# Patient Record
Sex: Male | Born: 1970
Health system: Southern US, Community
[De-identification: ages and names within clinical notes are randomized; demographics above are authoritative.]

## PROBLEM LIST (undated history)

## (undated) DIAGNOSIS — M5412 Radiculopathy, cervical region: Secondary | ICD-10-CM

## (undated) DIAGNOSIS — Z79891 Long term (current) use of opiate analgesic: Secondary | ICD-10-CM

## (undated) DIAGNOSIS — N503 Cyst of epididymis: Secondary | ICD-10-CM

## (undated) DIAGNOSIS — G589 Mononeuropathy, unspecified: Secondary | ICD-10-CM

## (undated) DIAGNOSIS — M5030 Other cervical disc degeneration, unspecified cervical region: Secondary | ICD-10-CM

## (undated) DIAGNOSIS — G894 Chronic pain syndrome: Secondary | ICD-10-CM

## (undated) HISTORY — PX: CHOLECYSTECTOMY: SHX55

## (undated) HISTORY — PX: RECONSTRUCTION MANDIBLE: SUR1082

---

## 2011-01-20 ENCOUNTER — Inpatient Hospital Stay (INDEPENDENT_AMBULATORY_CARE_PROVIDER_SITE_OTHER)
Admission: RE | Admit: 2011-01-20 | Discharge: 2011-01-20 | Disposition: A | Discharge status: Home / Self Care | Payer: Medicare HMO | Source: Ambulatory Visit | Attending: Emergency Medicine | Admitting: Emergency Medicine

## 2011-01-20 ENCOUNTER — Encounter: Payer: Self-pay | Admitting: Emergency Medicine

## 2011-01-20 DIAGNOSIS — M25529 Pain in unspecified elbow: Secondary | ICD-10-CM

## 2011-01-20 DIAGNOSIS — R29818 Other symptoms and signs involving the nervous system: Secondary | ICD-10-CM

## 2011-01-20 DIAGNOSIS — R509 Fever, unspecified: Secondary | ICD-10-CM

## 2011-01-20 LAB — CONVERTED CEMR LAB
Blood in Urine, dipstick: NEGATIVE
Glucose, Urine, Semiquant: NEGATIVE
Nitrite: NEGATIVE
Protein, U semiquant: NEGATIVE
Rapid Strep: NEGATIVE
Specific Gravity, Urine: >=1.03
Urobilinogen, UA: 0.2

## 2011-01-23 ENCOUNTER — Telehealth (INDEPENDENT_AMBULATORY_CARE_PROVIDER_SITE_OTHER): Payer: Self-pay | Admitting: *Deleted

## 2011-06-24 NOTE — Telephone Encounter (Signed)
  Phone Note Outgoing Call Call back at [facility] Phone [phone]   Call placed by: Lajean Saver RN,  January 23, 2011 11:29 AM Call placed to: Patient Summary of Call: Callback: No answer. Message left that "labs were normal" and to call with questions or concerns     Appended Document:  Patient called back and reports that he is improving. He will f/u with his PCP if needed

## 2011-06-24 NOTE — Progress Notes (Signed)
Summary: achey joints/TM(rm5)   Vital Signs:  Patient Profile:   40 Years Old Male CC:      previous fever, swollen fingers, body aches Height:     74 inches Weight:      180.4 pounds O2 Sat:      99 % O2 treatment:    Room Air Temp:     98.0 degrees F oral Pulse rate:   89 / minute Resp:     20 per minute BP sitting:   99 / 64  (left arm) Cuff size:   regular  Vitals Entered By: Linton Flemings RN (January 20, 2011 1:39 PM)                  Updated Prior Medication List: LUNESTA 3 MG TABS (ESZOPICLONE) rarely taken  Current Allergies: No known allergies History of Present Illness History from: patient Chief Complaint: previous fever, swollen fingers, body aches History of Present Illness: Initially had fever, body aches, muscle aches.  Now with swollen fingers which has improved since yesterday.  No rash, HA, tick bites.  Recently traveled to [place].    REVIEW OF SYSTEMS Constitutional Symptoms       Complains of chills and fatigue.     Denies fever, night sweats, weight loss, and weight gain.  Eyes       Denies change in vision, eye pain, eye discharge, glasses, contact lenses, and eye surgery. Ear/Nose/Throat/Mouth       Denies hearing loss/aids, change in hearing, ear pain, ear discharge, dizziness, frequent runny nose, frequent nose bleeds, sinus problems, sore throat, hoarseness, and tooth pain or bleeding.  Respiratory       Denies dry cough, productive cough, wheezing, shortness of breath, asthma, bronchitis, and emphysema/COPD.  Cardiovascular       Complains of tires easily with exhertion.      Denies murmurs and chest pain.    Gastrointestinal       Denies stomach pain, nausea/vomiting, diarrhea, constipation, blood in bowel movements, and indigestion. Genitourniary       Denies painful urination, kidney stones, and loss of urinary control. Neurological       Complains of numbness and tingling.      Denies paralysis, seizures, and  fainting/blackouts. Musculoskeletal       Complains of muscle pain, joint pain, joint stiffness, decreased range of motion, redness, and swelling.      Denies muscle weakness and gout.  Skin       Denies bruising, unusual mles/lumps or sores, and hair/skin or nail changes.  Psych       Denies mood changes, temper/anger issues, anxiety/stress, speech problems, depression, and sleep problems. Other Comments: symtoms started one week ago with 2 day fever, joint pain(knees, shoulders, fingers) 4-5days ago and swelling started 2 days ago. t-max 102. states he just have not been feeling normal   Past History:  Past Medical History: Unremarkable  Past Surgical History: deviated septum repair,  corrective jaw sx  Family History: none  Social History: smoke-no alcohol-no drugs-no Physical Exam General appearance: well developed, well nourished, no acute distress Ears: normal, no lesions or deformities Nasal: mucosa pink, nonedematous, no septal deviation, turbinates normal Oral/Pharynx: tongue normal, posterior pharynx without erythema or exudate Chest/Lungs: no rales, wheezes, or rhonchi bilateral, breath sounds equal without effort Heart: regular rate and  rhythm, no murmur Extremities: I don't see any finger swelling, no tenderness in joints, FROM Skin: no obvious rashes or lesions MSE: oriented to time, place, and  person Assessment New Problems: MUSCULOSKELETAL PAIN (ICD-781.99) FEVER (ICD-780.60) PAIN IN JOINT, UPPER ARM (ICD-719.42)   Plan New Medications/Changes: PREDNISONE (PAK) 10 MG TABS (PREDNISONE) 6 day pack, use as directed  #1 x 0, 01/20/2011, Hoyt Koch MD  New Orders: New Patient Level III [phone] UA Dipstick w/o Micro (automated)  [81003] Rapid Strep [87880] T-CBC w/Diff [60454-09811] T-Uric Acid (Blood) [91478-29562] T- * Misc. Laboratory test [99999] T-Rheumatoid Factor [13086-57846] T-Iron [96295-28413] TLB-CK-MB (Creatine Kinase MB)  [82553-CKMB] Planning Comments:   May just be a normal viral infection and is getting better.  Rx for a pred pack.  However with some strange symptoms such as finger swelling, chronic back spasms.  Will check labs to start ruling out other things such as polymyositis, hemochromatosis, rheumatoid, lymes.  Rapid strep neg so likely not scarlett fever.  If improving, should let body rest and hydrate.  If not, then should f/u with PCP to start talking about further workup.    The patient and/or caregiver has been counseled thoroughly with regard to medications prescribed including dosage, schedule, interactions, rationale for use, and possible side effects and they verbalize understanding.  Diagnoses and expected course of recovery discussed and will return if not improved as expected or if the condition worsens. Patient and/or caregiver verbalized understanding.  Prescriptions: PREDNISONE (PAK) 10 MG TABS (PREDNISONE) 6 day pack, use as directed  #1 x 0   Entered and Authorized by:   Hoyt Koch MD   Signed by:   Hoyt Koch MD on 01/20/2011   Method used:   Print then Give to Patient   RxID:   [id number]   Orders Added: 1)  New Patient Level III [34742] 2)  UA Dipstick w/o Micro (automated)  [81003] 3)  Rapid Strep [87880] 4)  T-CBC w/Diff [59563-87564] 5)  T-Uric Acid (Blood) [84550-23180] 6)  T- * Misc. Laboratory test [99999] 7)  T-Rheumatoid Factor [phone] 8)  T-Iron [66063-01601] 9)  TLB-CK-MB (Creatine Kinase MB) [82553-CKMB]    Laboratory Results   Urine Tests  Date/Time Received: January 20, 2011 2:24 PM  Date/Time Reported: January 20, 2011 2:24 PM   Routine Urinalysis   Color: dark yellow Appearance: Clear Glucose: negative   (Normal Range: Negative) Bilirubin: negative   (Normal Range: Negative) Ketone: trace (5)   (Normal Range: Negative) Spec. Gravity: >=1.030   (Normal Range: 1.003-1.035) Blood: negative   (Normal Range: Negative) pH: 5.5    (Normal Range: 5.0-8.0) Protein: negative   (Normal Range: Negative) Urobilinogen: 0.2   (Normal Range: 0-1) Nitrite: negative   (Normal Range: Negative) Leukocyte Esterace: negative   (Normal Range: Negative)    Date/Time Received: January 20, 2011 1:59 PM  Date/Time Reported: January 20, 2011 1:59 PM   Other Tests  Rapid Strep: negative

## 2011-07-12 ENCOUNTER — Emergency Department (HOSPITAL_BASED_OUTPATIENT_CLINIC_OR_DEPARTMENT_OTHER)
Admission: EM | Admit: 2011-07-12 | Discharge: 2011-07-12 | Disposition: A | Discharge status: Home / Self Care | Payer: Medicare HMO | Attending: Emergency Medicine | Admitting: Emergency Medicine

## 2011-07-12 ENCOUNTER — Emergency Department (INDEPENDENT_AMBULATORY_CARE_PROVIDER_SITE_OTHER): Payer: Medicare HMO

## 2011-07-12 DIAGNOSIS — R51 Headache: Secondary | ICD-10-CM

## 2011-07-12 DIAGNOSIS — H5710 Ocular pain, unspecified eye: Secondary | ICD-10-CM | POA: Insufficient documentation

## 2011-07-12 DIAGNOSIS — J3489 Other specified disorders of nose and nasal sinuses: Secondary | ICD-10-CM

## 2011-07-12 DIAGNOSIS — J329 Chronic sinusitis, unspecified: Secondary | ICD-10-CM | POA: Insufficient documentation

## 2011-07-12 DIAGNOSIS — R220 Localized swelling, mass and lump, head: Secondary | ICD-10-CM

## 2011-07-12 LAB — CBC
MCH: 30 pg (ref 26.0–34.0)
MCV: 81.4 fL (ref 78.0–100.0)
Platelets: 179 10*3/uL (ref 150–400)
RBC: 4.9 MIL/uL (ref 4.22–5.81)
RDW: 12.8 % (ref 11.5–15.5)
WBC: 6.1 10*3/uL (ref 4.0–10.5)

## 2011-07-12 LAB — BASIC METABOLIC PANEL
CO2: 31 mEq/L (ref 19–32)
Calcium: 9.2 mg/dL (ref 8.4–10.5)
Creatinine, Ser: 1 mg/dL (ref 0.50–1.35)
Glucose, Bld: 87 mg/dL (ref 70–99)

## 2011-07-12 MED ORDER — OXYCODONE-ACETAMINOPHEN 5-325 MG PO TABS: 1.0000 | ORAL_TABLET | ORAL | Status: AC | PRN

## 2011-07-12 MED ORDER — AMOXICILLIN-POT CLAVULANATE 500-125 MG PO TABS: 1.0000 | ORAL_TABLET | Freq: Two times a day (BID) | ORAL | Status: AC

## 2011-07-12 MED ORDER — KETOROLAC TROMETHAMINE 30 MG/ML IJ SOLN
30.0000 mg | Freq: Once | INTRAMUSCULAR | Status: AC
  Administered 2011-07-12: 30 mg via INTRAVENOUS
  Filled 2011-07-12: qty 1

## 2011-07-12 MED ORDER — IOHEXOL 350 MG/ML SOLN
100.0000 mL | Freq: Once | INTRAVENOUS | Status: AC | PRN
  Administered 2011-07-12: 100 mL via INTRAVENOUS

## 2011-07-12 NOTE — ED Provider Notes (Signed)
History     CSN: [id number]  Arrival date & time 07/12/11  1435   First MD Initiated Contact with Patient 07/12/11 1544      Chief Complaint  Patient presents with  . Eye Problem  . Headache    (Consider location/radiation/quality/duration/timing/severity/associated sxs/prior treatment) The history is provided by the patient.   the patient reports left maxillary sinus surgery approximately one month ago.  He's continued to have pain in his left face since then the scene his oral surgeon several times however yesterday developed worsening pain and mild pain with range of motion of his left eye.  He says he awoke this morning he had a small amount of swelling to his left lower lid which is now resolved.  He reports his vision in his left eye is normal.  He denies fevers and chills.  He denies dental pain.  He denies trismus or malocclusion.  He said no neck pain or headache.  He's had no changes in his vision.  Nothing worsens the symptoms.  Nothing improves his symptoms.  Symptoms are constant.  He called his oral surgeon today however his oral surgery office was closed.  He is not currently on antibiotics  History reviewed. No pertinent past medical history.  Past Surgical History  Procedure Date  . Eye surgery   . Mouth surgery   . Nasal septum surgery     No family history on file.  History  Substance Use Topics  . Smoking status: Never Smoker   . Smokeless tobacco: Never Used  . Alcohol Use: Yes     occasional      Review of Systems  All other systems reviewed and are negative.    Allergies  Review of patient's allergies indicates no known allergies.  Home Medications   Current Outpatient Rx  Name Route Sig Dispense Refill  . ONE-DAILY MULTI VITAMINS PO TABS Oral Take 1 tablet by mouth daily.      Marland Kitchen PSEUDOEPHEDRINE-IBUPROFEN 30-200 MG PO CAPS Oral Take 2 tablets by mouth every 4 (four) hours as needed. For congestion     . SALINE NASAL SPRAY 0.65 % NA SOLN  Each Nare Place 2 sprays into both nostrils daily as needed. For congestion     . AMOXICILLIN-POT CLAVULANATE 500-125 MG PO TABS Oral Take 1 tablet (500 mg total) by mouth 2 (two) times daily. 28 tablet 0  . OXYCODONE-ACETAMINOPHEN 5-325 MG PO TABS Oral Take 1 tablet by mouth every 4 (four) hours as needed for pain. 35 tablet 0    BP 141/85  Pulse 93  Temp(Src) 98.5 F (36.9 C) (Oral)  Resp 16  Ht 6\' 1"  (1.854 m)  Wt 180 lb (81.647 kg)  BMI 23.75 kg/m2  SpO2 99%  Physical Exam  Nursing note and vitals reviewed. Constitutional: He is oriented to person, place, and time. He appears well-developed and well-nourished.  HENT:  Head: Normocephalic and atraumatic.       Mild tenderness over left maxillary sinus.  He has normal extraocular movements of his bilateral eyes.  Eyes: Conjunctivae, EOM and lids are normal. Pupils are equal, round, and reactive to light. Right eye exhibits no discharge, no exudate and no hordeolum. Left eye exhibits no chemosis, no discharge, no exudate and no hordeolum.  Neck: Normal range of motion.  Cardiovascular: Normal rate and regular rhythm.   Pulmonary/Chest: Effort normal and breath sounds normal.  Abdominal: Soft.  Musculoskeletal: Normal range of motion.  Neurological: He is alert and oriented to  person, place, and time.  Skin: Skin is warm and dry.  Psychiatric: He has a normal mood and affect. Judgment normal.    ED Course  Procedures (including critical care time)  Labs Reviewed  CBC - Abnormal; Notable for the following:    MCHC 36.8 (*)    All other components within normal limits  BASIC METABOLIC PANEL   Ct Maxillofacial W/cm  07/12/2011  *RADIOLOGY REPORT*  Clinical Data: Left facial pain and swelling.  3 weeks status post left facial and sinus surgery the  CT MAXILLOFACIAL WITH CONTRAST  Technique:  Multidetector CT imaging of the maxillofacial structures was performed with intravenous contrast. Multiplanar CT image reconstructions  were also generated.  Contrast: OMNIPAQUE IOHEXOL 350 MG/ML IV SOLN  Comparison: None.  Findings: Old fixation plate and screws are seen within the maxilla.  Postop changes are seen from resection of the medial wall of the left maxillary sinus as well as an enterostomy in the anterior wall of the left maxillary sinus.  Moderate mucosal thickening is seen within the left maxillary sinus, however there is no evidence of air-fluid levels.  No abscess is seen within the left facial or periorbital soft tissues.  No definite focal inflammatory process identified.  The globes and intraorbital contents are normal in appearance.  No evidence of acute fracture or bone destruction involving the orbits or facial bones.  The  IMPRESSION:  1.  Postop changes from recent left maxillary sinus surgery. Moderate maxillary sinus mucosal thickening.  No sinus air fluid level. 2.  No evidence of facial or periorbital abscess. 3.  Normal appearance of the globes and intraorbital contents.  Original Report Authenticated By: Danae Orleans, M.D.   I personally reviewed the patient's CT scan  1. Sinusitis       MDM  The patient has evidence of left maxillary sinusitis without extension into his orbital space.  We put on 14 days of Augmentin and instructed to follow up closely with his oral surgeon        Lyanne Co, MD 07/12/11 [phone]

## 2011-07-12 NOTE — ED Notes (Signed)
Pt reports left eye pain that started yesterday and headache unrelieved after taking Aleve.

## 2012-08-15 ENCOUNTER — Encounter (HOSPITAL_BASED_OUTPATIENT_CLINIC_OR_DEPARTMENT_OTHER): Payer: Self-pay | Admitting: *Deleted

## 2012-08-15 ENCOUNTER — Emergency Department (HOSPITAL_BASED_OUTPATIENT_CLINIC_OR_DEPARTMENT_OTHER)
Admission: EM | Admit: 2012-08-15 | Discharge: 2012-08-15 | Disposition: A | Discharge status: Home / Self Care | Payer: BC Managed Care – PPO | Attending: Emergency Medicine | Admitting: Emergency Medicine

## 2012-08-15 DIAGNOSIS — R209 Unspecified disturbances of skin sensation: Secondary | ICD-10-CM | POA: Insufficient documentation

## 2012-08-15 DIAGNOSIS — M549 Dorsalgia, unspecified: Secondary | ICD-10-CM | POA: Insufficient documentation

## 2012-08-15 DIAGNOSIS — M542 Cervicalgia: Secondary | ICD-10-CM | POA: Insufficient documentation

## 2012-08-15 DIAGNOSIS — M5020 Other cervical disc displacement, unspecified cervical region: Secondary | ICD-10-CM | POA: Insufficient documentation

## 2012-08-15 HISTORY — DX: Mononeuropathy, unspecified: G58.9

## 2012-08-15 MED ORDER — HYDROCODONE-ACETAMINOPHEN 10-325 MG PO TABS: 1.0000 | ORAL_TABLET | Freq: Four times a day (QID) | ORAL | Status: DC | PRN

## 2012-08-15 MED ORDER — DEXAMETHASONE SODIUM PHOSPHATE 10 MG/ML IJ SOLN
10.0000 mg | Freq: Once | INTRAMUSCULAR | Status: AC
  Administered 2012-08-15: 10 mg via INTRAMUSCULAR
  Filled 2012-08-15: qty 1

## 2012-08-15 MED ORDER — HYDROMORPHONE HCL PF 1 MG/ML IJ SOLN
1.0000 mg | Freq: Once | INTRAMUSCULAR | Status: DC
  Filled 2012-08-15: qty 1

## 2012-08-15 NOTE — ED Notes (Signed)
Pt states he has been seeing an orthopedist for about 4 months. Given steroid injections, last one being about 3 weeks ago. Dx'd with "pinched nerve and compressed disc" Returned from a trip yesterday and woke up today with increased pain and numbness to left arm. No neuro deficits noted. EKG done at triage.

## 2012-08-15 NOTE — ED Provider Notes (Signed)
History   This chart was scribed for Rolan Bucco, MD scribed by Magnus Sinning. The patient was seen in room MH11/MH11 at 15:41   CSN: [id number]  Arrival date & time 08/15/12  1429    Chief Complaint  Patient presents with  . Arm Pain    (Consider location/radiation/quality/duration/timing/severity/associated sxs/prior treatment) Patient is a 42 y.o. male presenting with arm pain. The history is provided by the patient. No language interpreter was used.  Arm Pain Pertinent negatives include no chest pain, no abdominal pain, no headaches and no shortness of breath.   Roger Gonzales is a 42 y.o. male who presents to the Emergency Department complaining of constant moderate left sided neck pain,  with associated numbness to 50% his left arm and intermittent tingling to the two second and third digits of the left hand, all onset today.  He denies weakness to hand/arm.  The patient explains that the neck pain goes down the left side of his neck but does not radiate into his shoulder.   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 He states he has not been feeling well since return and that he was having back pain and stiffness last night. He explains today he woke up with mild numbness of his left arm this morning and tingling sxs. He reports taking prescribed hydrocodone last night and this morning, as well as two advil last night. He says the pain is aggravated with movement and says he was dx'd with pinched nerve and compressed disk between c4 and c5, which he treated with epidural steroid injections for the last 4-5 months. He believes hx of sxs was exacerbated by sitting for long periods of time during flights. He has had similar symptoms of left arm numbness similar to todays episode in the past.  He says is last epidural injection was about 1 month ago, which he says provided relief for the past three weeks. Reports he has had intermittent similar sxs for the past 5 months.   The  patient denies lower extremity numbness or weakness, lower back pain, fever, cough, cold, or vomiting.  States he plans to see orthopedist, Dr. Ethelene Hal in three days for follow up.  Past Medical History  Diagnosis Date  . Compressed cervical disc   . Pinched nerve     Past Surgical History  Procedure Date  . Eye surgery   . Mouth surgery   . Nasal septum surgery     History reviewed. No pertinent family history.  History  Substance Use Topics  . Smoking status: Never Smoker   . Smokeless tobacco: Never Used  . Alcohol Use: Yes     Comment: occasional      Review of Systems  Constitutional: Negative for fever, chills, diaphoresis and fatigue.  HENT: Positive for neck pain. Negative for congestion, rhinorrhea and sneezing.   Eyes: Negative.   Respiratory: Negative for cough, chest tightness and shortness of breath.   Cardiovascular: Negative for chest pain and leg swelling.  Gastrointestinal: Negative for nausea, vomiting, abdominal pain, diarrhea and blood in stool.  Genitourinary: Negative for frequency, hematuria, flank pain and difficulty urinating.  Musculoskeletal: Positive for back pain. Negative for arthralgias.  Skin: Negative for rash.  Neurological: Positive for numbness. Negative for dizziness, speech difficulty, weakness and headaches.  All other systems reviewed and are negative.    Allergies  Review of patient's allergies indicates no known allergies.  Home Medications   Current Outpatient Rx  Name  Route  Sig  Dispense  Refill  . HYDROCODONE-ACETAMINOPHEN 10-325 MG PO TABS   Oral   Take 1 tablet by mouth every 6 (six) hours as needed for pain.   6 tablet   0   . ONE-DAILY MULTI VITAMINS PO TABS   Oral   Take 1 tablet by mouth daily.           Marland Kitchen PSEUDOEPHEDRINE-IBUPROFEN 30-200 MG PO CAPS   Oral   Take 2 tablets by mouth every 4 (four) hours as needed. For congestion          . SALINE NASAL SPRAY 0.65 % NA SOLN   Each Nare   Place 2  sprays into both nostrils daily as needed. For congestion            BP 103/67  Pulse 73  Temp 98.3 F (36.8 C) (Oral)  Resp 14  Ht 6\' 1"  (1.854 m)  Wt 175 lb (79.379 kg)  BMI 23.09 kg/m2  SpO2 100%  Physical Exam  Constitutional: He is oriented to person, place, and time. He appears well-developed and well-nourished.  HENT:  Head: Normocephalic and atraumatic.  Eyes: Pupils are equal, round, and reactive to light.  Neck: Normal range of motion. Neck supple.  Cardiovascular: Normal rate, regular rhythm and normal heart sounds.   Pulmonary/Chest: Effort normal and breath sounds normal. No respiratory distress. He has no wheezes. He has no rales. He exhibits no tenderness.  Abdominal: Soft. Bowel sounds are normal. There is no tenderness. There is no rebound and no guarding.  Musculoskeletal: Normal range of motion. He exhibits tenderness. He exhibits no edema.       Tenderness to the left lower cervical peri spinal area and left trapezius.   Lymphadenopathy:    He has no cervical adenopathy.  Neurological: He is alert and oriented to person, place, and time. He has normal strength. No cranial nerve deficit or sensory deficit.  Skin: Skin is warm and dry. No rash noted.  Psychiatric: He has a normal mood and affect.    ED Course  Procedures (including critical care time) DIAGNOSTIC STUDIES: Oxygen Saturation is 100% on room air, normal by my interpretation.    COORDINATION OF CARE: 15:45: Physical exam performed Labs Reviewed - No data to display No results found.   1. HNP (herniated nucleus pulposus), cervical       MDM  Pt with left neck pain with radiculopathy and some left arm tingling/numbness.  No weakness.  Similar symptoms over past few months.  Pt seeing Dr Ethelene Hal who prescribes Hydrocodone.  Says that he is out.  Will rx enough to last over weekend, given shot of dilaudid/decadron.  Will f/u with Dr Ethelene Hal. I personally performed the services described in  this documentation, which was scribed in my presence.  The recorded information has been reviewed and considered.         Rolan Bucco, MD 08/15/12 [phone]

## 2012-09-19 HISTORY — PX: OTHER SURGICAL HISTORY: SHX169

## 2013-10-20 HISTORY — PX: OTHER SURGICAL HISTORY: SHX169

## 2013-10-28 ENCOUNTER — Encounter (HOSPITAL_BASED_OUTPATIENT_CLINIC_OR_DEPARTMENT_OTHER): Payer: Self-pay | Admitting: Emergency Medicine

## 2013-10-28 ENCOUNTER — Emergency Department (HOSPITAL_BASED_OUTPATIENT_CLINIC_OR_DEPARTMENT_OTHER)
Admission: EM | Admit: 2013-10-28 | Discharge: 2013-10-28 | Disposition: A | Discharge status: Home / Self Care | Payer: BC Managed Care – PPO | Attending: Emergency Medicine | Admitting: Emergency Medicine

## 2013-10-28 DIAGNOSIS — Z8739 Personal history of other diseases of the musculoskeletal system and connective tissue: Secondary | ICD-10-CM | POA: Insufficient documentation

## 2013-10-28 DIAGNOSIS — Z9889 Other specified postprocedural states: Secondary | ICD-10-CM | POA: Insufficient documentation

## 2013-10-28 DIAGNOSIS — R221 Localized swelling, mass and lump, neck: Principal | ICD-10-CM

## 2013-10-28 DIAGNOSIS — Z79899 Other long term (current) drug therapy: Secondary | ICD-10-CM | POA: Insufficient documentation

## 2013-10-28 DIAGNOSIS — R220 Localized swelling, mass and lump, head: Secondary | ICD-10-CM | POA: Insufficient documentation

## 2013-10-28 DIAGNOSIS — Z8669 Personal history of other diseases of the nervous system and sense organs: Secondary | ICD-10-CM | POA: Insufficient documentation

## 2013-10-28 MED ORDER — CLINDAMYCIN HCL 300 MG PO CAPS: ORAL_CAPSULE | ORAL | Status: DC

## 2013-10-28 MED ORDER — HYDROCODONE-ACETAMINOPHEN 5-325 MG PO TABS: 2.0000 | ORAL_TABLET | ORAL | Status: DC | PRN

## 2013-10-28 NOTE — Discharge Instructions (Signed)

## 2013-10-28 NOTE — ED Notes (Signed)
States he had surgery 2 weeks ago to remove screws from his right sinus that were infected from a previous surgery. He woke with slight swelling to the right side of his face and feels he may have a slight infection. Here today for pain control. Was unable to see his MD in the office.

## 2013-10-28 NOTE — ED Provider Notes (Signed)
CSN: [id number]     Arrival date & time 10/28/13  1118 History   First MD Initiated Contact with Patient 10/28/13 1243     Chief Complaint  Patient presents with  . Dental Pain     (Consider location/radiation/quality/duration/timing/severity/associated sxs/prior Treatment) Patient is a 43 y.o. male presenting with tooth pain. The history is provided by the patient. No language interpreter was used.  Dental Pain Location:  Upper Upper teeth location:  2/RU 2nd molar and 3/RU 1st molar Quality:  Aching Severity:  Moderate Onset quality:  Gradual Duration:  2 days Timing:  Constant Progression:  Worsening Chronicity:  New Prior workup: oral surgery. Relieved by:  Nothing Worsened by:  Nothing tried Associated symptoms: no fever   Risk factors: no alcohol problem     Past Medical History  Diagnosis Date  . Compressed cervical disc   . Pinched nerve    Past Surgical History  Procedure Laterality Date  . Eye surgery    . Mouth surgery    . Nasal septum surgery     No family history on file. History  Substance Use Topics  . Smoking status: Never Smoker   . Smokeless tobacco: Never Used  . Alcohol Use: Yes     Comment: occasional    Review of Systems  Constitutional: Negative for fever.  Musculoskeletal: Negative for joint swelling.  All other systems reviewed and are negative.     Allergies  Review of patient's allergies indicates no known allergies.  Home Medications   Current Outpatient Rx  Name  Route  Sig  Dispense  Refill  . clindamycin (CLEOCIN) 300 MG capsule      One tablet po qid   28 capsule   0   . HYDROcodone-acetaminophen (NORCO/VICODIN) 5-325 MG per tablet   Oral   Take 2 tablets by mouth every 4 (four) hours as needed.   20 tablet   0   . Multiple Vitamin (MULTIVITAMIN) tablet   Oral   Take 1 tablet by mouth daily.           . Pseudoephedrine-Ibuprofen (ADVIL COLD & SINUS LIQUI-GELS) 30-200 MG CAPS   Oral   Take 2 tablets by  mouth every 4 (four) hours as needed. For congestion          . sodium chloride (OCEAN) 0.65 % nasal spray   Each Nare   Place 2 sprays into both nostrils daily as needed. For congestion           BP 135/87  Pulse 76  Temp(Src) 97.3 F (36.3 C) (Oral)  Resp 18  Ht 6\' 1"  (1.854 m)  Wt 175 lb (79.379 kg)  BMI 23.09 kg/m2  SpO2 98% Physical Exam  Nursing note and vitals reviewed. Constitutional: He is oriented to person, place, and time. He appears well-developed and well-nourished.  HENT:  Head: Normocephalic and atraumatic.  Eyes: EOM are normal. Pupils are equal, round, and reactive to light.  Neck: Normal range of motion.  Pulmonary/Chest: Effort normal.  Abdominal: He exhibits no distension.  Musculoskeletal: Normal range of motion.  Sutures in upper mouth,  erythema around area  Neurological: He is alert and oriented to person, place, and time.  Skin: There is erythema.  Psychiatric: He has a normal mood and affect.    ED Course  Procedures (including critical care time) Labs Review Labs Reviewed - No data to display Imaging Review No results found.   EKG Interpretation None      MDM  Final diagnoses:  Swelling of gums   Clindamycin  Hydrocodone Call your surgeon to be seen      Elson AreasLeslie K Sofia, PA-C 10/28/13 1721

## 2013-10-29 NOTE — ED Provider Notes (Signed)
Medical screening examination/treatment/procedure(s) were performed by non-physician practitioner and as supervising physician I was immediately available for consultation/collaboration.   EKG Interpretation None        Kristell Wooding T Kimberlyann Hollar, MD 10/29/13 0706 

## 2013-11-29 ENCOUNTER — Encounter (HOSPITAL_COMMUNITY): Payer: Self-pay | Admitting: Emergency Medicine

## 2013-11-29 ENCOUNTER — Emergency Department (HOSPITAL_COMMUNITY)
Admission: EM | Admit: 2013-11-29 | Discharge: 2013-11-29 | Disposition: A | Discharge status: Home / Self Care | Payer: BC Managed Care – PPO | Attending: Emergency Medicine | Admitting: Emergency Medicine

## 2013-11-29 DIAGNOSIS — M5020 Other cervical disc displacement, unspecified cervical region: Secondary | ICD-10-CM | POA: Insufficient documentation

## 2013-11-29 DIAGNOSIS — Z8781 Personal history of (healed) traumatic fracture: Secondary | ICD-10-CM | POA: Insufficient documentation

## 2013-11-29 DIAGNOSIS — M5412 Radiculopathy, cervical region: Secondary | ICD-10-CM | POA: Insufficient documentation

## 2013-11-29 DIAGNOSIS — R5383 Other fatigue: Secondary | ICD-10-CM

## 2013-11-29 DIAGNOSIS — M542 Cervicalgia: Secondary | ICD-10-CM

## 2013-11-29 DIAGNOSIS — Z79899 Other long term (current) drug therapy: Secondary | ICD-10-CM | POA: Insufficient documentation

## 2013-11-29 DIAGNOSIS — M79609 Pain in unspecified limb: Secondary | ICD-10-CM | POA: Insufficient documentation

## 2013-11-29 DIAGNOSIS — G1229 Other motor neuron disease: Secondary | ICD-10-CM | POA: Insufficient documentation

## 2013-11-29 DIAGNOSIS — R5381 Other malaise: Secondary | ICD-10-CM | POA: Insufficient documentation

## 2013-11-29 MED ORDER — NAPROXEN 500 MG PO TABS: 500.0000 mg | ORAL_TABLET | Freq: Two times a day (BID) | ORAL | Status: DC

## 2013-11-29 MED ORDER — DIAZEPAM 5 MG/ML IJ SOLN
5.0000 mg | Freq: Once | INTRAMUSCULAR | Status: AC
  Administered 2013-11-29: 5 mg via INTRAVENOUS
  Filled 2013-11-29: qty 2

## 2013-11-29 MED ORDER — HYDROCODONE-ACETAMINOPHEN 5-325 MG PO TABS: 2.0000 | ORAL_TABLET | ORAL | Status: DC | PRN

## 2013-11-29 MED ORDER — PREDNISONE 20 MG PO TABS
ORAL_TABLET | ORAL | Status: DC
Start: 2013-11-29 — End: 2014-03-11

## 2013-11-29 NOTE — ED Provider Notes (Signed)
CSN: [id number]     Arrival date & time 11/29/13  1326 History   First MD Initiated Contact with Patient 11/29/13 1524     Chief Complaint  Patient presents with  . Leg Pain  . Neck Pain     (Consider location/radiation/quality/duration/timing/severity/associated sxs/prior Treatment) HPI Comments: Is a 43 year old male with history of cervical degenerative disease presents with worsening left arm and left leg pain. Patient has had neck pain for the past 2 years, intermittent with mild radiation down the left arm however the past 2 weeks he has noticed subjective numbness and even weakness in the left arm. He does feel most of his pain related however the numbness down the left arm and the pain down the left leg is fairly new. He has not had any focal weakness. No IV drug use, fevers, new injuries, bowel or bladder changes. Patient is healthy, exercises regularly, nonsmoker and minimal alcohol. Patient has been taking NSAIDs, narcotics and muscle relaxants which mildly improves the pain. [facility] approximately 10 days. Patient had an MRI approximately 2 years ago.  Patient is a 43 y.o. male presenting with leg pain and neck pain. The history is provided by the patient.  Leg Pain Associated symptoms: neck pain   Associated symptoms: no back pain and no fever   Neck Pain Associated symptoms: leg pain, numbness and weakness   Associated symptoms: no chest pain, no fever and no headaches     Past Medical History  Diagnosis Date  . Compressed cervical disc   . Pinched nerve    Past Surgical History  Procedure Laterality Date  . Eye surgery    . Mouth surgery    . Nasal septum surgery    . Mandible fracture surgery     No family history on file. History  Substance Use Topics  . Smoking status: Never Smoker   . Smokeless tobacco: Never Used  . Alcohol Use: Yes     Comment: occasional    Review of Systems  Constitutional: Negative for  fever and chills.  Eyes: Negative for visual disturbance.  Respiratory: Negative for shortness of breath.   Cardiovascular: Negative for chest pain.  Gastrointestinal: Negative for vomiting and abdominal pain.  Genitourinary: Negative for difficulty urinating.  Musculoskeletal: Positive for neck pain. Negative for back pain and neck stiffness.  Skin: Negative for rash.  Neurological: Positive for weakness and numbness. Negative for light-headedness and headaches.      Allergies  Review of patient's allergies indicates no known allergies.  Home Medications   Prior to Admission medications   Medication Sig Start Date End Date Taking? Authorizing Provider  cyclobenzaprine (FLEXERIL) 10 MG tablet Take 10 mg by mouth 3 (three) times daily as needed for muscle spasms.  11/10/13  Yes Historical Provider, MD  HYDROcodone-acetaminophen (NORCO/VICODIN) 5-325 MG per tablet Take 1 tablet by mouth every 4 (four) hours as needed for moderate pain.   Yes Historical Provider, MD  meloxicam (MOBIC) 15 MG tablet Take 15 mg by mouth daily. 11/08/13  Yes Historical Provider, MD  sodium chloride (OCEAN) 0.65 % nasal spray Place 2 sprays into both nostrils daily as needed. For congestion    Yes Historical Provider, MD   BP 114/74  Pulse 82  Temp(Src) 97.5 F (36.4 C) (Oral)  Resp 16  SpO2 97% Physical Exam  Nursing note and vitals reviewed. Constitutional: He is oriented to person, place, and time. He appears well-developed and well-nourished.  HENT:  Head: Normocephalic and atraumatic.  Eyes: Conjunctivae are normal. Right eye exhibits no discharge. Left eye exhibits no discharge.  Neck: Normal range of motion. Neck supple. No tracheal deviation present.  Cardiovascular: Normal rate and regular rhythm.   Pulmonary/Chest: Effort normal and breath sounds normal.  Abdominal: Soft. He exhibits no distension. There is no tenderness. There is no guarding.  Musculoskeletal: He exhibits tenderness. He  exhibits no edema.  Mild tenderness left proximal paraspinal cervical region. No midline tenderness, full range of motion of head and neck.  Neurological: He is alert and oriented to person, place, and time. GCS eye subscore is 4. GCS verbal subscore is 5. GCS motor subscore is 6.  Reflex Scores:      Bicep reflexes are 2+ on the right side and 2+ on the left side.      Patellar reflexes are 2+ on the right side and 2+ on the left side.      Achilles reflexes are 1+ on the right side and 1+ on the left side. 5+ strength in UE and LE with f/e at major joints. Sensation to palpation intact in UE and LE. CNs 2-12 grossly intact.  EOMFI.  PERRL.   Visual fields  Grossly intact.   Skin: Skin is warm. No rash noted.  Psychiatric: He has a normal mood and affect.    ED Course  Procedures (including critical care time) Labs Review Labs Reviewed - No data to display  Imaging Review No results found.   EKG Interpretation None      MDM   Final diagnoses:  Neck pain on left side  Cervical radiculopathy   Patient with known cervical disc/degenerative disease and close followup with neurosurgery.  We discussed his mild worsening symptoms and that he will require an MRI however his exam in the ER is unremarkable with normal strength, normal sensation good reflexes and very well appearing.  I offered to perform the MRI in the ER versus have him see neurosurgery for likely MRI and patient was comfortable with see neurosurgery for the MRI. We discussed reasons to return including bowel or bladder changes, focal weakness or other. Patient has capacity to make decisions and she noticing come back at any time.  We will try steroid, muscle relaxants and pain meds. Results and differential diagnosis were discussed with the patient. Close follow up outpatient was discussed, patient comfortable with the plan.   Filed Vitals:   11/29/13 1332  BP: 114/74  Pulse: 82  Temp: 97.5 F (36.4 C)   TempSrc: Oral  Resp: 16  SpO2: 97%       Enid SkeensJoshua M Tovia Kisner, MD 11/29/13 1558

## 2013-11-29 NOTE — Discharge Instructions (Signed)
Take the steroids as directed. Return to the ER for focal weakness, uncontrollable pain, changes in bowel or bladder function or worsening symptoms. Followup with neurosurgery as directed. Discuss MRI with neurosurgery.  If you were given medicines take as directed.  If you are on coumadin or contraceptives realize their levels and effectiveness is altered by many different medicines.  If you have any reaction (rash, tongues swelling, other) to the medicines stop taking and see a physician.   Please follow up as directed and return to the ER or see a physician for new or worsening symptoms.  Thank you. Filed Vitals:   11/29/13 1332  BP: 114/74  Pulse: 82  Temp: 97.5 F (36.4 C)  TempSrc: Oral  Resp: 16  SpO2: 97%    Cervical Radiculopathy Cervical radiculopathy happens when a nerve in the neck is pinched or bruised by a slipped (herniated) disk or by arthritic changes in the bones of the cervical spine. This can occur due to an injury or as part of the normal aging process. Pressure on the cervical nerves can cause pain or numbness that runs from your neck all the way down into your arm and fingers. CAUSES  There are many possible causes, including:  Injury.  Muscle tightness in the neck from overuse.  Swollen, painful joints (arthritis).  Breakdown or degeneration in the bones and joints of the spine (spondylosis) due to aging.  Bone spurs that may develop near the cervical nerves. SYMPTOMS  Symptoms include pain, weakness, or numbness in the affected arm and hand. Pain can be severe or irritating. Symptoms may be worse when extending or turning the neck. DIAGNOSIS  Your caregiver will ask about your symptoms and do a physical exam. He or she may test your strength and reflexes. X-rays, CT scans, and MRI scans may be needed in cases of injury or if the symptoms do not go away after a period of time. Electromyography (EMG) or nerve conduction testing may be done to study how your  nerves and muscles are working. TREATMENT  Your caregiver may recommend certain exercises to help relieve your symptoms. Cervical radiculopathy can, and often does, get better with time and treatment. If your problems continue, treatment options may include:  Wearing a soft collar for short periods of time.  Physical therapy to strengthen the neck muscles.  Medicines, such as nonsteroidal anti-inflammatory drugs (NSAIDs), oral corticosteroids, or spinal injections.  Surgery. Different types of surgery may be done depending on the cause of your problems. HOME CARE INSTRUCTIONS   Put ice on the affected area.  Put ice in a plastic bag.  Place a towel between your skin and the bag.  Leave the ice on for 15-20 minutes, 03-04 times a day or as directed by your caregiver.  If ice does not help, you can try using heat. Take a warm shower or bath, or use a hot water bottle as directed by your caregiver.  You may try a gentle neck and shoulder massage.  Use a flat pillow when you sleep.  Only take over-the-counter or prescription medicines for pain, discomfort, or fever as directed by your caregiver.  If physical therapy was prescribed, follow your caregiver's directions.  If a soft collar was prescribed, use it as directed. SEEK IMMEDIATE MEDICAL CARE IF:   Your pain gets much worse and cannot be controlled with medicines.  You have weakness or numbness in your hand, arm, face, or leg.  You have a high fever or a stiff,  rigid neck.  You lose bowel or bladder control (incontinence).  You have trouble with walking, balance, or speaking. MAKE SURE YOU:   Understand these instructions.  Will watch your condition.  Will get help right away if you are not doing well or get worse. Document Released: 04/02/2001 Document Revised: 09/30/2011 Document Reviewed: 02/19/2011 [facility] Patient Information 2014 [place].

## 2013-11-29 NOTE — ED Provider Notes (Signed)
Medical screening examination/treatment/procedure(s) were performed by non-physician practitioner and as supervising physician I was immediately available for consultation/collaboration.   EKG Interpretation None        Hisashi Amadon, MD 11/29/13 1736 

## 2013-11-29 NOTE — ED Notes (Addendum)
Neck pain and left leg soreness worse foot and heel no new injuries [place]

## 2013-11-29 NOTE — ED Provider Notes (Signed)
MSE was initiated and I personally evaluated the patient and placed orders (if any) at  2:24 PM on Nov 29, 2013.  The patient appears stable so that the remainder of the MSE may be completed by another provider.  Patient is a 43 year old male past medical history significant for compressed cervical disc with pinched nerve presented to the emergency department for 2 weeks of gradually worsening cervical neck pain with worsening numbness and tingling down left arm. Patient states over the weekend he developed left leg pain which is new for him. He states he has been seeing [facility] in the past for his neck pain with a combination of physical therapy and steroid injections with little no improvement he is due to [facility] weeks but has had such an increase and severity of his pain despite using anti-inflammatories and muscle relaxants prescribed to him. Denies any fevers, chills. Pain medication have been ordered. Patient will be moved to an acute bed for evaluation for possible CT versus MR of cervical spine.  Jeannetta EllisJennifer L Ziah Turvey, PA-C 11/29/13 1628

## 2014-03-08 ENCOUNTER — Other Ambulatory Visit: Payer: Self-pay | Admitting: Urology

## 2014-03-10 ENCOUNTER — Encounter (HOSPITAL_BASED_OUTPATIENT_CLINIC_OR_DEPARTMENT_OTHER): Payer: Self-pay | Admitting: *Deleted

## 2014-03-11 ENCOUNTER — Encounter (HOSPITAL_BASED_OUTPATIENT_CLINIC_OR_DEPARTMENT_OTHER): Payer: Self-pay | Admitting: *Deleted

## 2014-03-11 NOTE — Progress Notes (Signed)
NPO AFTER MN. ARRIVE AT 0730. NEEDS HG. MAY TAKE TRAMADOL IF NEEDED AM DOS W/ SIPS OF WATER.

## 2014-03-18 ENCOUNTER — Encounter (HOSPITAL_BASED_OUTPATIENT_CLINIC_OR_DEPARTMENT_OTHER)
Admission: RE | Disposition: A | Discharge status: Home / Self Care | Payer: Self-pay | Source: Ambulatory Visit | Attending: Urology

## 2014-03-18 ENCOUNTER — Ambulatory Visit (HOSPITAL_BASED_OUTPATIENT_CLINIC_OR_DEPARTMENT_OTHER): Payer: BC Managed Care – PPO | Admitting: Anesthesiology

## 2014-03-18 ENCOUNTER — Encounter (HOSPITAL_BASED_OUTPATIENT_CLINIC_OR_DEPARTMENT_OTHER): Payer: BC Managed Care – PPO | Admitting: Anesthesiology

## 2014-03-18 ENCOUNTER — Ambulatory Visit (HOSPITAL_BASED_OUTPATIENT_CLINIC_OR_DEPARTMENT_OTHER)
Admission: RE | Admit: 2014-03-18 | Discharge: 2014-03-18 | Disposition: A | Discharge status: Home / Self Care | Payer: BC Managed Care – PPO | Source: Ambulatory Visit | Attending: Urology | Admitting: Urology

## 2014-03-18 ENCOUNTER — Encounter (HOSPITAL_BASED_OUTPATIENT_CLINIC_OR_DEPARTMENT_OTHER): Payer: Self-pay

## 2014-03-18 DIAGNOSIS — N4340 Spermatocele of epididymis, unspecified: Secondary | ICD-10-CM | POA: Diagnosis not present

## 2014-03-18 DIAGNOSIS — N508 Other specified disorders of male genital organs: Secondary | ICD-10-CM | POA: Diagnosis present

## 2014-03-18 HISTORY — DX: Other cervical disc degeneration, unspecified cervical region: M50.30

## 2014-03-18 HISTORY — DX: Cyst of epididymis: N50.3

## 2014-03-18 HISTORY — PX: SPERMATOCELECTOMY: SHX2420

## 2014-03-18 LAB — POCT HEMOGLOBIN-HEMACUE: Hemoglobin: 16.5 g/dL (ref 13.0–17.0)

## 2014-03-18 SURGERY — EXCISION, SPERMATOCELE
Anesthesia: General | Site: Scrotum | Laterality: Right

## 2014-03-18 MED ORDER — SODIUM CHLORIDE 0.9 % IR SOLN
Status: DC | PRN
  Administered 2014-03-18: 500 mL

## 2014-03-18 MED ORDER — FENTANYL CITRATE 0.05 MG/ML IJ SOLN
25.0000 ug | INTRAMUSCULAR | Status: DC | PRN
  Filled 2014-03-18: qty 1

## 2014-03-18 MED ORDER — MIDAZOLAM HCL 2 MG/2ML IJ SOLN
INTRAMUSCULAR | Status: AC
Start: 2014-03-18 — End: 2014-03-18
  Filled 2014-03-18: qty 2

## 2014-03-18 MED ORDER — FENTANYL CITRATE 0.05 MG/ML IJ SOLN
INTRAMUSCULAR | Status: DC | PRN
  Administered 2014-03-18 (×2): 50 ug via INTRAVENOUS

## 2014-03-18 MED ORDER — DEXAMETHASONE SODIUM PHOSPHATE 4 MG/ML IJ SOLN
INTRAMUSCULAR | Status: DC | PRN
  Administered 2014-03-18: 10 mg via INTRAVENOUS

## 2014-03-18 MED ORDER — MEPERIDINE HCL 25 MG/ML IJ SOLN
6.2500 mg | INTRAMUSCULAR | Status: DC | PRN
  Filled 2014-03-18: qty 1

## 2014-03-18 MED ORDER — BUPIVACAINE HCL (PF) 0.25 % IJ SOLN
INTRAMUSCULAR | Status: DC | PRN
  Administered 2014-03-18: 12 mL

## 2014-03-18 MED ORDER — PROPOFOL 10 MG/ML IV BOLUS
INTRAVENOUS | Status: DC | PRN
  Administered 2014-03-18: 200 mg via INTRAVENOUS

## 2014-03-18 MED ORDER — LACTATED RINGERS IV SOLN
INTRAVENOUS | Status: DC
  Administered 2014-03-18: 08:00:00 via INTRAVENOUS
  Filled 2014-03-18: qty 1000

## 2014-03-18 MED ORDER — OXYCODONE-ACETAMINOPHEN 5-325 MG PO TABS
ORAL_TABLET | ORAL | Status: AC
  Filled 2014-03-18: qty 1

## 2014-03-18 MED ORDER — LACTATED RINGERS IV SOLN
INTRAVENOUS | Status: DC
  Filled 2014-03-18: qty 1000

## 2014-03-18 MED ORDER — PROMETHAZINE HCL 25 MG/ML IJ SOLN
6.2500 mg | INTRAMUSCULAR | Status: DC | PRN
  Filled 2014-03-18: qty 1

## 2014-03-18 MED ORDER — ACETAMINOPHEN 10 MG/ML IV SOLN
INTRAVENOUS | Status: DC | PRN
  Administered 2014-03-18: 1000 mg via INTRAVENOUS

## 2014-03-18 MED ORDER — CEFAZOLIN SODIUM 1-5 GM-% IV SOLN
1.0000 g | INTRAVENOUS | Status: AC
  Administered 2014-03-18: 2 g via INTRAVENOUS
  Filled 2014-03-18: qty 50

## 2014-03-18 MED ORDER — CEFAZOLIN SODIUM-DEXTROSE 2-3 GM-% IV SOLR
INTRAVENOUS | Status: AC
Start: 2014-03-18 — End: 2014-03-18
  Filled 2014-03-18: qty 50

## 2014-03-18 MED ORDER — ONDANSETRON HCL 4 MG/2ML IJ SOLN
INTRAMUSCULAR | Status: DC | PRN
  Administered 2014-03-18: 4 mg via INTRAVENOUS

## 2014-03-18 MED ORDER — OXYCODONE-ACETAMINOPHEN 5-325 MG PO TABS
1.0000 | ORAL_TABLET | ORAL | Status: DC | PRN
  Administered 2014-03-18: 1 via ORAL
  Filled 2014-03-18: qty 1

## 2014-03-18 MED ORDER — FENTANYL CITRATE 0.05 MG/ML IJ SOLN
INTRAMUSCULAR | Status: AC
Start: 2014-03-18 — End: 2014-03-18
  Filled 2014-03-18: qty 4

## 2014-03-18 MED ORDER — MIDAZOLAM HCL 5 MG/5ML IJ SOLN
INTRAMUSCULAR | Status: DC | PRN
  Administered 2014-03-18: 2 mg via INTRAVENOUS

## 2014-03-18 MED ORDER — OXYCODONE-ACETAMINOPHEN 5-325 MG PO TABS: 1.0000 | ORAL_TABLET | ORAL | Status: DC | PRN

## 2014-03-18 MED ORDER — LIDOCAINE HCL (CARDIAC) 20 MG/ML IV SOLN
INTRAVENOUS | Status: DC | PRN
  Administered 2014-03-18: 70 mg via INTRAVENOUS

## 2014-03-18 SURGICAL SUPPLY — 42 items
BLADE CLIPPER SURG (BLADE) ×2 IMPLANT
BLADE SURG 15 STRL LF DISP TIS (BLADE) ×1 IMPLANT
BLADE SURG 15 STRL SS (BLADE) ×1
BNDG GAUZE ELAST 4 BULKY (GAUZE/BANDAGES/DRESSINGS) ×2 IMPLANT
BRIEF STRETCH FOR OB PAD LRG (UNDERPADS AND DIAPERS) IMPLANT
CANISTER SUCTION 2500CC (MISCELLANEOUS) ×2 IMPLANT
CLEANER CAUTERY TIP 5X5 PAD (MISCELLANEOUS) ×1 IMPLANT
COVER MAYO STAND STRL (DRAPES) ×2 IMPLANT
COVER TABLE BACK 60X90 (DRAPES) ×2 IMPLANT
DERMABOND ADVANCED (GAUZE/BANDAGES/DRESSINGS) ×1
DERMABOND ADVANCED .7 DNX12 (GAUZE/BANDAGES/DRESSINGS) ×1 IMPLANT
DRAPE PED LAPAROTOMY (DRAPES) ×2 IMPLANT
ELECT NEEDLE TIP 2.8 STRL (NEEDLE) ×2 IMPLANT
ELECT REM PT RETURN 9FT ADLT (ELECTROSURGICAL) ×2
ELECTRODE REM PT RTRN 9FT ADLT (ELECTROSURGICAL) ×1 IMPLANT
GLOVE BIO SURGEON STRL SZ 6.5 (GLOVE) ×2 IMPLANT
GLOVE BIO SURGEON STRL SZ7 (GLOVE) ×2 IMPLANT
GLOVE BIO SURGEON STRL SZ7.5 (GLOVE) ×2 IMPLANT
GLOVE INDICATOR 6.5 STRL GRN (GLOVE) ×2 IMPLANT
GLOVE INDICATOR 7.5 STRL GRN (GLOVE) ×2 IMPLANT
GOWN STRL REUS W/ TWL LRG LVL3 (GOWN DISPOSABLE) ×1 IMPLANT
GOWN STRL REUS W/ TWL XL LVL3 (GOWN DISPOSABLE) ×2 IMPLANT
GOWN STRL REUS W/TWL LRG LVL3 (GOWN DISPOSABLE) ×1
GOWN STRL REUS W/TWL XL LVL3 (GOWN DISPOSABLE) ×2
NEEDLE HYPO 22GX1.5 SAFETY (NEEDLE) IMPLANT
NS IRRIG 500ML POUR BTL (IV SOLUTION) ×4 IMPLANT
PACK BASIN DAY SURGERY FS (CUSTOM PROCEDURE TRAY) ×2 IMPLANT
PAD CLEANER CAUTERY TIP 5X5 (MISCELLANEOUS) ×1
PENCIL BUTTON HOLSTER BLD 10FT (ELECTRODE) ×2 IMPLANT
SPONGE GAUZE 4X4 12PLY STER LF (GAUZE/BANDAGES/DRESSINGS) ×2 IMPLANT
SUT MNCRL AB 4-0 PS2 18 (SUTURE) ×2 IMPLANT
SUT VIC AB 3-0 SH 27 (SUTURE) ×2
SUT VIC AB 3-0 SH 27X BRD (SUTURE) ×2 IMPLANT
SUT VICRYL 3 0 BR 18  UND (SUTURE) ×1
SUT VICRYL 3 0 BR 18 UND (SUTURE) ×1 IMPLANT
SYR BULB IRRIGATION 50ML (SYRINGE) ×2 IMPLANT
SYR CONTROL 10ML LL (SYRINGE) ×2 IMPLANT
TOWEL OR 17X24 6PK STRL BLUE (TOWEL DISPOSABLE) ×4 IMPLANT
TRAY DSU PREP LF (CUSTOM PROCEDURE TRAY) ×2 IMPLANT
TUBE CONNECTING 12X1/4 (SUCTIONS) ×2 IMPLANT
WATER STERILE IRR 500ML POUR (IV SOLUTION) IMPLANT
YANKAUER SUCT BULB TIP NO VENT (SUCTIONS) ×2 IMPLANT

## 2014-03-18 NOTE — Discharge Instructions (Signed)

## 2014-03-18 NOTE — Op Note (Signed)
Preoperative diagnosis:  1. right epididymal cyst  Postoperative diagnosis:  right spermatocele   Procedure:       right spermatocelectomy  Surgeon: Crist Fat, MD  Anesthesia: General  Complications: None  Intraoperative findings: Small epididymal cysts at the head of the right dissected cleanly and sent to pathology   EBL: Minimal  Specimens: right epididymal  Indication: Indication: Roger Gonzales is a 43 y.o. patient with small right hydrocele.  After reviewing the management options for treatment, he elected to proceed with the above surgical procedure(s). We have discussed the potential benefits and risks of the procedure, side effects of the proposed treatment, the likelihood of the patient achieving the goals of the procedure, and any potential problems that might occur during the procedure or recuperation. Informed consent has been obtained.  Description of procedure:  The patient was taken to the operating room and general anesthesia was induced. The patient was placed on the table in supine position, general anesthesia was then induced and an LMA inserted. The scrotum was then prepped and draped in the routine sterile fashion. A timeout was then held with confirmation of antibiotics.   I then made a midline incision through the scrotal median raphae through the skin and into the dartos. Once through several layers the dartos was able to get the right testicle and contents out of the right  hemiscrotum and into the surgical field.    Meticulous dissection then ensured and the epididymal cyst was gently dissected from the head of the epididymis intact.  It was sent to pathology.  Meticulous hemostasis was then achieved. The right hemiscrotum was then copiously irrigated and a final check for hemostasis performed. I then closed the dartos with a 3-0 Vicryl in a running/locking stitch. I closed the skin with a 4-0 Monocryl in a subcuticular running fashion. I then  injected 10 cc of quarter percent Marcaine into the incision, and then placed Dermabond over the incision. A fluff dressing and mesh underpants were then applied area.  The patient tolerated the procedure without any perioperative complications. At the end of the case all last needles and sponges had been accounted for. The patient was returned to the PACU in excellent condition.   Crist Fat, M.D.

## 2014-03-18 NOTE — Interval H&P Note (Signed)
History and Physical Interval Note: Right epididymal cyst excision.  03/18/2014 8:58 AM  Roger Gonzales  has presented today for surgery, with the diagnosis of RIGHT EPIDIDYMAL CYST  The various methods of treatment have been discussed with the patient and family. After consideration of risks, benefits and other options for treatment, the patient has consented to  Procedure(s): RIGHT SPERMATOCELECTOMY (Right) as a surgical intervention .  The patient's history has been reviewed, patient examined, no change in status, stable for surgery.  I have reviewed the patient's chart and labs.  Questions were answered to the patient's satisfaction.     Berniece Salines W

## 2014-03-18 NOTE — Anesthesia Procedure Notes (Signed)
Procedure Name: LMA Insertion Date/Time: 03/18/2014 9:13 AM Performed by: Tyrone Nine Pre-anesthesia Checklist: Patient identified, Emergency Drugs available, Suction available, Patient being monitored and Timeout performed Patient Re-evaluated:Patient Re-evaluated prior to inductionOxygen Delivery Method: Circle system utilized Preoxygenation: Pre-oxygenation with 100% oxygen Intubation Type: IV induction Ventilation: Mask ventilation without difficulty LMA: LMA inserted LMA Size: 4.0 Number of attempts: 1 Airway Equipment and Method: Bite block Placement Confirmation: positive ETCO2 Tube secured with: Tape Dental Injury: Teeth and Oropharynx as per pre-operative assessment

## 2014-03-18 NOTE — H&P (Signed)
Reason For Visit f/u for right epididymal cyst   History of Present Illness 89M, patient of Dr. Nolen Mu, MD, who is seen in follow-up for epididymal cyst.  He was seen 1 month ago by Dr. Sherron Monday and given anti-inflammatory, antibioitic and pain medications for thought of a reactive cyst with assoicated tenderness. He underwent a scrotal u/s which demonstrated a ~3cm epididymal cyst with no associated hyperemia to suggest inflammatory or reactive process.   Interval: The patient presents today to discuss removal of his epididymal cyst. He states that it is causing a tugging sensation in his right testicle. He denies any significant pain, but states that it is annoying. There are no additional associated signs or symptoms or modifying factors.   Surgical History Problems  1. History of Jaw Surgery 2. History of Skin Tag Removal  Current Meds 1. Aleve TABS;  Therapy: (Recorded:03Jun2015) to Recorded 2. Ciprofloxacin HCl - 250 MG Oral Tablet; Take 1 tablet twice daily;  Therapy: 03Jun2015 to (Evaluate:10Jun2015); Last Rx:03Jun2015 Ordered 3. Claritin TABS;  Therapy: (Recorded:03Jun2015) to Recorded 4. Cyclobenzaprine HCl TABS;  Therapy: (Recorded:03Jun2015) to Recorded 5. Hydrocodone-Acetaminophen 5-325 MG Oral Tablet; Take 1 tablet as needed;  Therapy: 03Jun2015 to (Last Rx:03Jun2015) Ordered 6. Meloxicam 15 MG Oral Tablet; Take 1 tablet daily;  Therapy: 03Jun2015 to (Evaluate:02Aug2015); Last Rx:03Jun2015 Ordered  Allergies Medication  1. No Known Drug Allergies  Family History Problems  1. Family history of cardiac disorder (V17.49) : Maternal Grandmother, Paternal  Grandmother, Maternal Grandfather, Paternal Grandfather  Social History Problems  1. Alcohol use (V49.89)   less than one per week 2. Caffeine use (V49.89)   1-2 drinks daily 3. Married 4. 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   Results/Data Urine [Data Includes: Last 1 Day]   16Jul2015  COLOR YELLOW   APPEARANCE CLEAR   SPECIFIC GRAVITY 1.010   pH 7.0   GLUCOSE NEG mg/dL  BILIRUBIN NEG   KETONE NEG mg/dL  BLOOD NEG   PROTEIN NEG mg/dL  UROBILINOGEN 0.2 mg/dL  NITRITE NEG   LEUKOCYTE ESTERASE NEG    The patient had a scrotal ultrasound performed approximately one month ago which I have independently reviewed which shows multiple cysts on the right testicle the largest one measuring between 2 and 3 cm.   Assessment Assessed  1. Spermatocele (608.1)  Patient has a right-sided mostly asymptomatic epididymal cyst.   Plan Epididymitis  1. Follow-up PRN Office  Follow-up  Status: Hold For - Appointment  Requested for:  16Jul2015 Health Maintenance  2. UA With REFLEX; [Do Not Release]; Status:Complete;   Done: 16Jul2015 01:53PM  Discussion/Summary I discussed the patient's condition with him in detail. We discussed treatment options including surgery and aspiration/sclerosis. We went over the surgery as well as the risk and benefits. I told him that this was a small cysts and relative terms and that he would likely be better off to live with it. If it got bigger or became more symptomatic surgery would likely be a better option. I did not recommend sclerosing this given the high recurrence rate. We did discuss a concomitant vasectomy and spermatocelectomy. The patient will follow up with me on an as-needed basis.

## 2014-03-18 NOTE — Anesthesia Postprocedure Evaluation (Signed)
  Anesthesia Post-op Note  Patient: Roger Gonzales  Procedure(s) Performed: Procedure(s) (LRB): RIGHT SPERMATOCELECTOMY (Right)  Patient Location: PACU  Anesthesia Type: General  Level of Consciousness: awake and alert   Airway and Oxygen Therapy: Patient Spontanous Breathing  Post-op Pain: mild  Post-op Assessment: Post-op Vital signs reviewed, Patient's Cardiovascular Status Stable, Respiratory Function Stable, Patent Airway and No signs of Nausea or vomiting  Last Vitals:  Filed Vitals:   03/18/14 1049  BP:   Pulse: 97  Temp:   Resp: 15    Post-op Vital Signs: stable   Complications: No apparent anesthesia complications

## 2014-03-18 NOTE — Anesthesia Preprocedure Evaluation (Addendum)
Anesthesia Evaluation  Patient identified by MRN, date of birth, ID band Patient awake    Reviewed: Allergy & Precautions, H&P , NPO status , Patient's Chart, lab work & pertinent test results  Airway Mallampati: I TM Distance: >3 FB Neck ROM: Full    Dental no notable dental hx. (+) Dental Advisory Given, Teeth Intact   Pulmonary neg pulmonary ROS,  breath sounds clear to auscultation  Pulmonary exam normal       Cardiovascular Exercise Tolerance: Good negative cardio ROS  Rhythm:Regular Rate:Normal     Neuro/Psych negative neurological ROS  negative psych ROS   GI/Hepatic negative GI ROS, Neg liver ROS,   Endo/Other  negative endocrine ROS  Renal/GU negative Renal ROSSpermatocele  negative genitourinary   Musculoskeletal negative musculoskeletal ROS (+)   Abdominal   Peds negative pediatric ROS (+)  Hematology negative hematology ROS (+)   Anesthesia Other Findings Past osteotomy of mandible  Reproductive/Obstetrics negative OB ROS                           Anesthesia Physical Anesthesia Plan  ASA: I  Anesthesia Plan: General   Post-op Pain Management:    Induction: Intravenous  Airway Management Planned: LMA  Additional Equipment:   Intra-op Plan:   Post-operative Plan: Extubation in OR  Informed Consent: I have reviewed the patients History and Physical, chart, labs and discussed the procedure including the risks, benefits and alternatives for the proposed anesthesia with the patient or authorized representative who has indicated his/her understanding and acceptance.   Dental advisory given  Plan Discussed with: CRNA  Anesthesia Plan Comments:         Anesthesia Quick Evaluation

## 2014-03-18 NOTE — Transfer of Care (Signed)
Immediate Anesthesia Transfer of Care Note  Patient: Roger Gonzales  Procedure(s) Performed: Procedure(s): RIGHT SPERMATOCELECTOMY (Right)  Patient Location: PACU  Anesthesia Type:General  Level of Consciousness: awake, alert , oriented and patient cooperative  Airway & Oxygen Therapy: Patient Spontanous Breathing and Patient connected to nasal cannula oxygen  Post-op Assessment: Report given to PACU RN and Post -op Vital signs reviewed and stable  Post vital signs: Reviewed and stable  Complications: No apparent anesthesia complications

## 2014-03-21 ENCOUNTER — Encounter (HOSPITAL_BASED_OUTPATIENT_CLINIC_OR_DEPARTMENT_OTHER): Payer: Self-pay | Admitting: Urology

## 2014-08-08 ENCOUNTER — Other Ambulatory Visit: Payer: Self-pay | Admitting: Anesthesiology

## 2014-08-08 DIAGNOSIS — M5412 Radiculopathy, cervical region: Secondary | ICD-10-CM

## 2014-08-15 ENCOUNTER — Ambulatory Visit
Admission: RE | Admit: 2014-08-15 | Discharge: 2014-08-15 | Disposition: A | Discharge status: Home / Self Care | Payer: BLUE CROSS/BLUE SHIELD | Source: Ambulatory Visit | Attending: Anesthesiology | Admitting: Anesthesiology

## 2014-08-15 DIAGNOSIS — M5412 Radiculopathy, cervical region: Secondary | ICD-10-CM

## 2014-09-02 ENCOUNTER — Emergency Department (HOSPITAL_COMMUNITY)
Admission: EM | Admit: 2014-09-02 | Discharge: 2014-09-03 | Disposition: A | Discharge status: Home / Self Care | Payer: BLUE CROSS/BLUE SHIELD | Attending: Emergency Medicine | Admitting: Emergency Medicine

## 2014-09-02 ENCOUNTER — Emergency Department (HOSPITAL_COMMUNITY): Payer: BLUE CROSS/BLUE SHIELD

## 2014-09-02 ENCOUNTER — Encounter (HOSPITAL_COMMUNITY): Payer: Self-pay | Admitting: Emergency Medicine

## 2014-09-02 DIAGNOSIS — Y998 Other external cause status: Secondary | ICD-10-CM | POA: Insufficient documentation

## 2014-09-02 DIAGNOSIS — R066 Hiccough: Secondary | ICD-10-CM

## 2014-09-02 DIAGNOSIS — Z79899 Other long term (current) drug therapy: Secondary | ICD-10-CM | POA: Diagnosis not present

## 2014-09-02 DIAGNOSIS — Z8669 Personal history of other diseases of the nervous system and sense organs: Secondary | ICD-10-CM | POA: Diagnosis not present

## 2014-09-02 DIAGNOSIS — M62838 Other muscle spasm: Secondary | ICD-10-CM | POA: Insufficient documentation

## 2014-09-02 DIAGNOSIS — Y9389 Activity, other specified: Secondary | ICD-10-CM | POA: Diagnosis not present

## 2014-09-02 DIAGNOSIS — Z87438 Personal history of other diseases of male genital organs: Secondary | ICD-10-CM | POA: Insufficient documentation

## 2014-09-02 DIAGNOSIS — Y9289 Other specified places as the place of occurrence of the external cause: Secondary | ICD-10-CM | POA: Insufficient documentation

## 2014-09-02 DIAGNOSIS — T426X5A Adverse effect of other antiepileptic and sedative-hypnotic drugs, initial encounter: Secondary | ICD-10-CM | POA: Diagnosis not present

## 2014-09-02 DIAGNOSIS — T50905A Adverse effect of unspecified drugs, medicaments and biological substances, initial encounter: Secondary | ICD-10-CM

## 2014-09-02 MED ORDER — LORAZEPAM 2 MG/ML IJ SOLN
1.5000 mg | Freq: Once | INTRAMUSCULAR | Status: AC
  Administered 2014-09-03: 1.5 mg via INTRAVENOUS
  Filled 2014-09-02: qty 1

## 2014-09-02 MED ORDER — DEXAMETHASONE SODIUM PHOSPHATE 10 MG/ML IJ SOLN
10.0000 mg | Freq: Once | INTRAMUSCULAR | Status: AC
  Administered 2014-09-03: 10 mg via INTRAVENOUS
  Filled 2014-09-02: qty 1

## 2014-09-02 MED ORDER — MORPHINE SULFATE 4 MG/ML IJ SOLN
4.0000 mg | Freq: Once | INTRAMUSCULAR | Status: AC
  Administered 2014-09-03: 4 mg via INTRAVENOUS
  Filled 2014-09-02: qty 1

## 2014-09-02 MED ORDER — KETOROLAC TROMETHAMINE 30 MG/ML IJ SOLN: 30.0000 mg | Freq: Once | INTRAMUSCULAR | Status: DC

## 2014-09-02 NOTE — ED Notes (Signed)
Pt presents with muscle spasms to upper body, pt states he has taken Gabapentin 1200mg  since 1900, this is normal dose but he has not been on meds for last 7 days.

## 2014-09-03 LAB — BASIC METABOLIC PANEL
Anion gap: 7 (ref 5–15)
BUN: 13 mg/dL (ref 6–23)
CHLORIDE: 106 mmol/L (ref 96–112)
CO2: 27 mmol/L (ref 19–32)
CREATININE: 0.99 mg/dL (ref 0.50–1.35)
Calcium: 9.3 mg/dL (ref 8.4–10.5)
GFR calc Af Amer: >90 mL/min (ref >90)
GFR calc non Af Amer: >90 mL/min (ref >90)
Glucose, Bld: 92 mg/dL (ref 70–99)
Potassium: 3.6 mmol/L (ref 3.5–5.1)
Sodium: 140 mmol/L (ref 135–145)

## 2014-09-03 MED ORDER — DIAZEPAM 5 MG PO TABS: 5.0000 mg | ORAL_TABLET | Freq: Four times a day (QID) | ORAL | Status: DC | PRN

## 2014-09-03 NOTE — ED Provider Notes (Signed)
CSN: [id number]     Arrival date & time 09/02/14  2224 History   First MD Initiated Contact with Patient 09/02/14 2300     Chief Complaint  Patient presents with  . Medication Reaction     HPI Patient presents to the emergency department complaining of muscle spasms in his diaphragm and chest that began at approximate 7 PM this evening.  He's been on Neurontin for a long time but recently had been out for 5 days.  He restarted his Neurontin at 300 mg every 6 hours.  He feels like this in the involuntary spasm in his chest.  He has no other complaints.  Denies abdominal pain.   Past Medical History  Diagnosis Date  . Pinched nerve     cervical  . DDD (degenerative disc disease), cervical   . Epididymal cyst     RIGHT   Past Surgical History  Procedure Laterality Date  . Reconstruction mandible  age 44    correct bite bilateral upper  . Removal lipoma, back  03/ 2014  . Removal hardware right upper mandible  04/ 2015  . Spermatocelectomy Right 03/18/2014    Procedure: RIGHT SPERMATOCELECTOMY;  Surgeon: Crist FatBenjamin W Herrick, MD;  [facility];  Service: Urology;  Laterality: Right;   No family history on file. History  Substance Use Topics  . Smoking status: Never Smoker   . Smokeless tobacco: Former NeurosurgeonUser    Types: Snuff    Quit date: 01/09/2014  . Alcohol Use: Yes     Comment: occasional    Review of Systems  All other systems reviewed and are negative.     Allergies  Review of patient's allergies indicates no known allergies.  Home Medications   Prior to Admission medications   Medication Sig Start Date End Date Taking? Authorizing Provider  cyclobenzaprine (FLEXERIL) 10 MG tablet Take 10 mg by mouth 3 (three) times daily as needed for muscle spasms.   Yes Historical Provider, MD  eszopiclone (LUNESTA) 2 MG TABS tablet Take 2 mg by mouth at bedtime as needed for sleep. Take immediately before bedtime   Yes Historical Provider, MD  gabapentin  (NEURONTIN) 300 MG capsule Take 300 mg by mouth 4 (four) times daily.   Yes Historical Provider, MD  traMADol (ULTRAM) 50 MG tablet Take 50 mg by mouth every 6 (six) hours as needed for moderate pain.    Yes Historical Provider, MD  diazepam (VALIUM) 5 MG tablet Take 1 tablet (5 mg total) by mouth every 6 (six) hours as needed for muscle spasms. 09/03/14   Lyanne CoKevin M Westley Blass, MD  gabapentin (NEURONTIN) 600 MG tablet Take 600 mg by mouth 2 (two) times daily.    Historical Provider, MD  oxyCODONE-acetaminophen (PERCOCET/ROXICET) 5-325 MG per tablet Take 1 tablet by mouth every 4 (four) hours as needed for moderate pain. Patient not taking: Reported on 09/02/2014 03/18/14   Elon Jesteravid C Johnson, MD   BP 145/93 mmHg  Pulse 96  Temp(Src) 98.3 F (36.8 C) (Oral)  Resp 20  SpO2 98% Physical Exam  Constitutional: He is oriented to person, place, and time. He appears well-developed and well-nourished.  HENT:  Head: Normocephalic and atraumatic.  Eyes: EOM are normal.  Neck: Normal range of motion.  Cardiovascular: Normal rate, regular rhythm, normal heart sounds and intact distal pulses.   Pulmonary/Chest: Effort normal and breath sounds normal. No respiratory distress.  Palpable spasm of his diaphragm  Abdominal: Soft. He exhibits no distension. There is no  tenderness.  Musculoskeletal: Normal range of motion.  Neurological: He is alert and oriented to person, place, and time.  Skin: Skin is warm and dry.  Psychiatric: He has a normal mood and affect. Judgment normal.  Nursing note and vitals reviewed.   ED Course  Procedures (including critical care time) Labs Review Labs Reviewed  BASIC METABOLIC PANEL    Imaging Review Dg Chest 2 View  09/02/2014   CLINICAL DATA:  44 year old male with medication reaction. Shortness of Breath. Initial encounter.  EXAM: CHEST  2 VIEW  COMPARISON:  None.  FINDINGS: The heart size and mediastinal contours are within normal limits. Mildly increased interstitial  markings, otherwise both lungs are clear. The visualized skeletal structures are unremarkable.  IMPRESSION: No active cardiopulmonary disease.   Electronically Signed   By: Odessa Fleming M.D.   On: 09/02/2014 23:59     EKG Interpretation None      MDM   Final diagnoses:  Medication reaction, initial encounter  Spasm of diaphragm      This appears to be spasm of his diaphragm likely from restarting his Neurontin.  He responded to benzodiazepines, Toradol, morphine, 10 mg IV Decadron.  He will talk with his physicians on Monday morning about how to restart his medications.  Lyanne Co, MD 09/03/14 [phone]

## 2015-04-09 ENCOUNTER — Encounter (HOSPITAL_COMMUNITY): Payer: Self-pay

## 2015-04-09 ENCOUNTER — Emergency Department (HOSPITAL_COMMUNITY)
Admission: EM | Admit: 2015-04-09 | Discharge: 2015-04-10 | Disposition: A | Discharge status: Home / Self Care | Payer: BLUE CROSS/BLUE SHIELD | Attending: Emergency Medicine | Admitting: Emergency Medicine

## 2015-04-09 DIAGNOSIS — Z79899 Other long term (current) drug therapy: Secondary | ICD-10-CM | POA: Insufficient documentation

## 2015-04-09 DIAGNOSIS — Z8669 Personal history of other diseases of the nervous system and sense organs: Secondary | ICD-10-CM | POA: Diagnosis not present

## 2015-04-09 DIAGNOSIS — Z87438 Personal history of other diseases of male genital organs: Secondary | ICD-10-CM | POA: Insufficient documentation

## 2015-04-09 DIAGNOSIS — M62838 Other muscle spasm: Secondary | ICD-10-CM | POA: Diagnosis not present

## 2015-04-09 LAB — BASIC METABOLIC PANEL
Anion gap: 8 (ref 5–15)
BUN: 12 mg/dL (ref 6–20)
CO2: 25 mmol/L (ref 22–32)
Calcium: 8.7 mg/dL — ABNORMAL LOW (ref 8.9–10.3)
Chloride: 104 mmol/L (ref 101–111)
Creatinine, Ser: 0.83 mg/dL (ref 0.61–1.24)
GFR calc Af Amer: >60 mL/min (ref >60)
Glucose, Bld: 97 mg/dL (ref 65–99)
POTASSIUM: 3.8 mmol/L (ref 3.5–5.1)
SODIUM: 137 mmol/L (ref 135–145)

## 2015-04-09 MED ORDER — MORPHINE SULFATE (PF) 4 MG/ML IV SOLN
4.0000 mg | Freq: Once | INTRAVENOUS | Status: AC
  Administered 2015-04-09: 4 mg via INTRAVENOUS
  Filled 2015-04-09: qty 1

## 2015-04-09 MED ORDER — KETOROLAC TROMETHAMINE 30 MG/ML IJ SOLN
30.0000 mg | Freq: Once | INTRAMUSCULAR | Status: AC
  Administered 2015-04-09: 30 mg via INTRAVENOUS
  Filled 2015-04-09: qty 1

## 2015-04-09 MED ORDER — SODIUM CHLORIDE 0.9 % IV SOLN
1.0000 g | Freq: Once | INTRAVENOUS | Status: AC
  Administered 2015-04-09: 1 g via INTRAVENOUS
  Filled 2015-04-09: qty 10

## 2015-04-09 MED ORDER — DEXAMETHASONE SODIUM PHOSPHATE 10 MG/ML IJ SOLN
10.0000 mg | Freq: Once | INTRAMUSCULAR | Status: AC
  Administered 2015-04-09: 10 mg via INTRAVENOUS
  Filled 2015-04-09: qty 1

## 2015-04-09 MED ORDER — LORAZEPAM 2 MG/ML IJ SOLN
1.0000 mg | Freq: Once | INTRAMUSCULAR | Status: AC
  Administered 2015-04-09: 1 mg via INTRAVENOUS
  Filled 2015-04-09: qty 1

## 2015-04-09 MED ORDER — SODIUM CHLORIDE 0.9 % IV BOLUS (SEPSIS)
500.0000 mL | Freq: Once | INTRAVENOUS | Status: AC
  Administered 2015-04-09: 500 mL via INTRAVENOUS

## 2015-04-09 MED ORDER — SODIUM CHLORIDE 0.9 % IV BOLUS (SEPSIS)
500.0000 mL | Freq: Once | INTRAVENOUS | Status: AC
Start: 2015-04-09 — End: 2015-04-09
  Administered 2015-04-09: 500 mL via INTRAVENOUS

## 2015-04-09 MED ORDER — HYDROMORPHONE HCL 1 MG/ML IJ SOLN
0.5000 mg | Freq: Once | INTRAMUSCULAR | Status: AC
  Administered 2015-04-09: 0.5 mg via INTRAVENOUS
  Filled 2015-04-09: qty 1

## 2015-04-09 MED ORDER — DIAZEPAM 5 MG/ML IJ SOLN
5.0000 mg | Freq: Once | INTRAMUSCULAR | Status: AC
  Administered 2015-04-09: 5 mg via INTRAVENOUS
  Filled 2015-04-09: qty 2

## 2015-04-09 NOTE — ED Notes (Signed)
Patient states he is having a medication reaction to gabapentin which he has had before. Patient states he is having spasms of his diaphragm.

## 2015-04-09 NOTE — ED Notes (Signed)
Pt has had suspected Rx to Gabapentin before consisting of "spasms in the diaphragm".  Pt has only his am dose of  (he takes TID normally) and had a quick onset of spasms that seem to be centered in the diaphragm.

## 2015-04-09 NOTE — ED Provider Notes (Signed)
CSN: [id number]     Arrival date & time 04/09/15  1816 History   First MD Initiated Contact with Patient 04/09/15 1844     Chief Complaint  Patient presents with  . Medication Reaction     (Consider location/radiation/quality/duration/timing/severity/associated sxs/prior Treatment) HPI Comments: Patient presents to the ER for evaluation of spasms of his chest wall and diaphragm. Patient reports that symptoms began earlier today. He has been experiencing intermittent spasms. Patient suspects that it is a reaction to his gabapentin. This is happened once before and he was treated in the ER with multiple medications which stopped the symptoms. At that time he had missed several doses of his Neurontin and then the spasms started when he retook it. He has not had any recent dosing changes prior to today, however. Patient is on Neurontin because of chronic cervical spine disease. No numbness, tingling, weakness of the upper extremities.   Past Medical History  Diagnosis Date  . Pinched nerve     cervical  . DDD (degenerative disc disease), cervical   . Epididymal cyst     RIGHT   Past Surgical History  Procedure Laterality Date  . Reconstruction mandible  age 70    correct bite bilateral upper  . Removal lipoma, back  03/ 2014  . Removal hardware right upper mandible  04/ 2015  . Spermatocelectomy Right 03/18/2014    Procedure: RIGHT SPERMATOCELECTOMY;  Surgeon: Crist Fat, MD;  Location: [facility];  Service: Urology;  Laterality: Right;   History reviewed. No pertinent family history. Social History  Substance Use Topics  . Smoking status: Never Smoker   . Smokeless tobacco: Former Neurosurgeon    Types: Snuff    Quit date: 01/09/2014  . Alcohol Use: Yes     Comment: occasional    Review of Systems  Respiratory: Negative.   All other systems reviewed and are negative.     Allergies  Review of patient's allergies indicates no known allergies.  Home  Medications   Prior to Admission medications   Medication Sig Start Date End Date Taking? Authorizing Saahas Hidrogo  gabapentin (NEURONTIN) 600 MG tablet Take 600 mg by mouth 3 (three) times daily.    Yes Historical Mylo Driskill, MD  ibuprofen (ADVIL,MOTRIN) 200 MG tablet Take 400 mg by mouth every 6 (six) hours as needed for moderate pain.   Yes Historical Xai Frerking, MD  loperamide (IMODIUM A-D) 2 MG tablet Take 2 mg by mouth 4 (four) times daily as needed for diarrhea or loose stools.   Yes Historical Donyelle Enyeart, MD  metaxalone (SKELAXIN) 800 MG tablet TAKE ONE TABLET (800 MG TOTAL) BY MOUTH 3 (THREE) TIMES A DAY. 02/24/15  Yes Historical Bonnita Newby, MD  oxyCODONE-acetaminophen (PERCOCET) 10-325 MG per tablet TAKE 1 TABLET BY MOUTH 3 TIMES A DAY AS NEEDED FOR PAIN 03/28/15  Yes Historical Jalisia Puchalski, MD  diazepam (VALIUM) 5 MG tablet Take 1 tablet (5 mg total) by mouth every 6 (six) hours as needed for muscle spasms. Patient not taking: Reported on 04/09/2015 09/03/14   Azalia Bilis, MD  oxyCODONE-acetaminophen (PERCOCET/ROXICET) 5-325 MG per tablet Take 1 tablet by mouth every 4 (four) hours as needed for moderate pain. Patient not taking: Reported on 09/02/2014 03/18/14   Harlene Salts, MD   BP 129/91 mmHg  Pulse 107  Temp(Src) 98 F (36.7 C) (Oral)  Resp 20  Ht  (1.854 m)  Wt 175 lb (79.379 kg)  BMI 23.09 kg/m2  SpO2 98% Physical Exam  Constitutional: He is  oriented to person, place, and time. He appears well-developed and well-nourished. No distress.  HENT:  Head: Normocephalic and atraumatic.  Right Ear: Hearing normal.  Left Ear: Hearing normal.  Nose: Nose normal.  Mouth/Throat: Oropharynx is clear and moist and mucous membranes are normal.  Eyes: Conjunctivae and EOM are normal. Pupils are equal, round, and reactive to light.  Neck: Normal range of motion. Neck supple.  Cardiovascular: Regular rhythm, S1 normal and S2 normal.  Exam reveals no gallop and no friction rub.   No murmur  heard. Pulmonary/Chest: Effort normal and breath sounds normal. No respiratory distress. He exhibits no tenderness.  Abdominal: Soft. Normal appearance and bowel sounds are normal. There is no hepatosplenomegaly. There is no tenderness. There is no rebound, no guarding, no tenderness at McBurney's point and negative Murphy's sign. No hernia.  Musculoskeletal: Normal range of motion.  Neurological: He is alert and oriented to person, place, and time. He has normal strength. No cranial nerve deficit or sensory deficit. Coordination normal. GCS eye subscore is 4. GCS verbal subscore is 5. GCS motor subscore is 6.  Skin: Skin is warm, dry and intact. No rash noted. No cyanosis.  Psychiatric: He has a normal mood and affect. His speech is normal and behavior is normal. Thought content normal.  Nursing note and vitals reviewed.   ED Course  Procedures (including critical care time) Labs Review Labs Reviewed  BASIC METABOLIC PANEL    Imaging Review No results found. I have personally reviewed and evaluated these images and lab results as part of my medical decision-making.   EKG Interpretation None      MDM   Final diagnoses:  None   muscle spasms  Patient presents to the ER for evaluation of spasms in his chest wall, diaphragm area. Patient reports having a similar episode in February, treated in the ER. At that time he was given Toradol, Decadron, Valium and morphine. The spasms improved with this treatment. These meds were repeated again today, but he did not have significant improvement. Patient was noted to have mild hypokalemia. It was felt that this might possibly be causing his spasms, patient was administered calcium but did not improve.  Patient was hydrated with IV fluids, given additional benzodiazepine medication with improvement. He was also given additional analgesia. Patient will be discharged with analgesia, follow up with his primary doctor in the office.  Gilda Crease, MD 04/10/15 [phone]

## 2015-04-09 NOTE — ED Notes (Signed)
Pt states that he had good relief of spasms for about 20 minutes.  He states that he is now beginning to have spasms in his neck.  That is how his episode started earlier today.  The spasms then progressed down to his abdomen.  Currently, he is not having any abdominal spasms.

## 2015-04-10 MED ORDER — DIAZEPAM 5 MG PO TABS: 5.0000 mg | ORAL_TABLET | Freq: Four times a day (QID) | ORAL | Status: DC | PRN

## 2015-04-10 MED ORDER — HYDROMORPHONE HCL 1 MG/ML IJ SOLN
1.0000 mg | Freq: Once | INTRAMUSCULAR | Status: AC
  Administered 2015-04-10: 1 mg via INTRAVENOUS
  Filled 2015-04-10: qty 1

## 2015-04-10 NOTE — Discharge Instructions (Signed)

## 2015-09-03 ENCOUNTER — Emergency Department (HOSPITAL_COMMUNITY)
Admission: EM | Admit: 2015-09-03 | Discharge: 2015-09-03 | Disposition: A | Discharge status: Home / Self Care | Payer: BLUE CROSS/BLUE SHIELD | Attending: Emergency Medicine | Admitting: Emergency Medicine

## 2015-09-03 ENCOUNTER — Encounter (HOSPITAL_COMMUNITY): Payer: Self-pay

## 2015-09-03 DIAGNOSIS — M549 Dorsalgia, unspecified: Secondary | ICD-10-CM | POA: Insufficient documentation

## 2015-09-03 DIAGNOSIS — M62838 Other muscle spasm: Secondary | ICD-10-CM | POA: Insufficient documentation

## 2015-09-03 DIAGNOSIS — R200 Anesthesia of skin: Secondary | ICD-10-CM | POA: Diagnosis not present

## 2015-09-03 DIAGNOSIS — M542 Cervicalgia: Secondary | ICD-10-CM | POA: Insufficient documentation

## 2015-09-03 NOTE — ED Notes (Signed)
No answer when pt's name called, unable to locate pt 

## 2015-09-03 NOTE — ED Notes (Signed)
Pt with back and neck pain x 3 days.  Pt on/off numbness to left arm.  Denies shortness of breath or chest pain.  Follows Md for condition.  Having muscle spasms in his neck.  No incontinence.

## 2015-09-03 NOTE — ED Notes (Signed)
No answer from waiting room.

## 2015-09-04 ENCOUNTER — Emergency Department (HOSPITAL_BASED_OUTPATIENT_CLINIC_OR_DEPARTMENT_OTHER)
Admission: EM | Admit: 2015-09-04 | Discharge: 2015-09-04 | Disposition: A | Discharge status: Home / Self Care | Payer: BLUE CROSS/BLUE SHIELD | Attending: Emergency Medicine | Admitting: Emergency Medicine

## 2015-09-04 ENCOUNTER — Encounter (HOSPITAL_BASED_OUTPATIENT_CLINIC_OR_DEPARTMENT_OTHER): Payer: Self-pay | Admitting: *Deleted

## 2015-09-04 DIAGNOSIS — Z8669 Personal history of other diseases of the nervous system and sense organs: Secondary | ICD-10-CM | POA: Insufficient documentation

## 2015-09-04 DIAGNOSIS — M542 Cervicalgia: Secondary | ICD-10-CM | POA: Diagnosis present

## 2015-09-04 DIAGNOSIS — M62838 Other muscle spasm: Secondary | ICD-10-CM | POA: Diagnosis not present

## 2015-09-04 DIAGNOSIS — Z79899 Other long term (current) drug therapy: Secondary | ICD-10-CM | POA: Insufficient documentation

## 2015-09-04 DIAGNOSIS — Z87448 Personal history of other diseases of urinary system: Secondary | ICD-10-CM | POA: Insufficient documentation

## 2015-09-04 DIAGNOSIS — R202 Paresthesia of skin: Secondary | ICD-10-CM | POA: Diagnosis not present

## 2015-09-04 DIAGNOSIS — G8929 Other chronic pain: Secondary | ICD-10-CM | POA: Diagnosis not present

## 2015-09-04 MED ORDER — DIAZEPAM 5 MG PO TABS
5.0000 mg | ORAL_TABLET | Freq: Once | ORAL | Status: AC
  Administered 2015-09-04: 5 mg via ORAL
  Filled 2015-09-04: qty 1

## 2015-09-04 MED ORDER — NAPROXEN 500 MG PO TABS: 500.0000 mg | ORAL_TABLET | Freq: Two times a day (BID) | ORAL | Status: DC | PRN

## 2015-09-04 MED ORDER — OXYCODONE-ACETAMINOPHEN 5-325 MG PO TABS
1.0000 | ORAL_TABLET | Freq: Once | ORAL | Status: AC
  Administered 2015-09-04: 1 via ORAL
  Filled 2015-09-04: qty 1

## 2015-09-04 MED ORDER — KETOROLAC TROMETHAMINE 30 MG/ML IJ SOLN
60.0000 mg | Freq: Once | INTRAMUSCULAR | Status: AC
  Administered 2015-09-04: 60 mg via INTRAMUSCULAR
  Filled 2015-09-04: qty 2

## 2015-09-04 MED ORDER — DIAZEPAM 5 MG PO TABS: 5.0000 mg | ORAL_TABLET | Freq: Four times a day (QID) | ORAL | Status: DC | PRN

## 2015-09-04 MED ORDER — OXYCODONE-ACETAMINOPHEN 5-325 MG PO TABS: 1.0000 | ORAL_TABLET | Freq: Four times a day (QID) | ORAL | Status: DC | PRN

## 2015-09-04 MED FILL — diazePAM 5 MG TABS: 5 | 2 days supply | Qty: 10 | Fill #0

## 2015-09-04 MED FILL — OXYCODONE/APAP 5-325: 5-325 | 3 days supply | Qty: 15 | Fill #0

## 2015-09-04 MED FILL — NAPROXEN 500 MG TABLET: 500 | 10 days supply | Qty: 20 | Fill #0

## 2015-09-04 NOTE — ED Provider Notes (Signed)
CSN: [id number]     Arrival date & time 09/04/15  1120 History   First MD Initiated Contact with Patient 09/04/15 1250     Chief Complaint  Patient presents with  . Neck Pain     (Consider location/radiation/quality/duration/timing/severity/associated sxs/prior Treatment) HPI Comments: Roger Gonzales is a 45 y.o. male with a PMHx of DDD and cervical pinched nerves, who presents to the ED with complaints of ongoing left neck pain which has been ongoing for 3 years but worse in the last 3 days. He described the pain is 7/10 constant sharp left neck pain radiating into the left shoulder and scapula, worse with sitting and standing, and unrelieved with Percocet gabapentin. Associated symptoms included tingling in the left hand. He was seen by his primary care doctor this morning, referred to neurosurgery and is awaiting their call back. He came here seeking improved pain control since his Percocet is not helping. He has had epidural shots in the past in [place], and his specialist there has suggested to him that he should seek surgical intervention for his chronic neck pain due to bulging disks. He denies any recent trauma or injury, no changes in his chronic pain. He denies any fevers, chills, chest pain, shortness breath, abdominal pain, nausea, vomiting, diarrhea, constipation, dysuria, hematuria, incontinence of urine or stool, saddle anesthesia or cauda equina symptoms, numbness, or focal weakness. Denies history of cancer or IV drug use.  Patient is a 45 y.o. male presenting with neck pain. The history is provided by the patient and medical records. No language interpreter was used.  Neck Pain Pain location:  L side Quality: sharp. Pain radiates to:  L scapula and L shoulder Pain severity:  Moderate Pain is:  Same all the time Onset quality:  Gradual Duration: 3 years but worse x3 days. Timing:  Constant Progression:  Worsening Chronicity:  Chronic Context: not recent injury     Relieved by:  Nothing Worsened by:  Position Ineffective treatments: gabapentin and percocet. Associated symptoms: tingling   Associated symptoms: no bladder incontinence, no bowel incontinence, no chest pain, no fever, no numbness and no weakness     Past Medical History  Diagnosis Date  . Pinched nerve     cervical  . DDD (degenerative disc disease), cervical   . Epididymal cyst     RIGHT   Past Surgical History  Procedure Laterality Date  . Reconstruction mandible  age 29    correct bite bilateral upper  . Removal lipoma, back  03/ 2014  . Removal hardware right upper mandible  04/ 2015  . Spermatocelectomy Right 03/18/2014    Procedure: RIGHT SPERMATOCELECTOMY;  Surgeon: Crist Fat, MD;  Location: [facility];  Service: Urology;  Laterality: Right;   No family history on file. Social History  Substance Use Topics  . Smoking status: Never Smoker   . Smokeless tobacco: Former Neurosurgeon    Types: Snuff    Quit date: 01/09/2014  . Alcohol Use: Yes     Comment: occasional    Review of Systems  Constitutional: Negative for fever and chills.  Respiratory: Negative for shortness of breath.   Cardiovascular: Negative for chest pain.  Gastrointestinal: Negative for nausea, vomiting, abdominal pain, diarrhea, constipation and bowel incontinence.  Genitourinary: Negative for bladder incontinence, dysuria, hematuria and difficulty urinating (no incontinence).  Musculoskeletal: Positive for neck pain. Negative for myalgias and arthralgias.  Skin: Negative for color change.  Allergic/Immunologic: Negative for immunocompromised state.  Neurological: Positive for tingling. Negative  for weakness and numbness.       +tingling L hand  Psychiatric/Behavioral: Negative for confusion.   10 Systems reviewed and are negative for acute change except as noted in the HPI.    Allergies  Review of patient's allergies indicates no known allergies.  Home Medications    Prior to Admission medications   Medication Sig Start Date End Date Taking? Authorizing Provider  venlafaxine (EFFEXOR) 75 MG tablet Take 75 mg by mouth 2 (two) times daily.   Yes Historical Provider, MD  diazepam (VALIUM) 5 MG tablet Take 1 tablet (5 mg total) by mouth every 6 (six) hours as needed for anxiety (spasms). 04/10/15   Gilda Crease, MD  gabapentin (NEURONTIN) 600 MG tablet Take 600 mg by mouth 3 (three) times daily.     Historical Provider, MD  ibuprofen (ADVIL,MOTRIN) 200 MG tablet Take 400 mg by mouth every 6 (six) hours as needed for moderate pain.    Historical Provider, MD  loperamide (IMODIUM A-D) 2 MG tablet Take 2 mg by mouth 4 (four) times daily as needed for diarrhea or loose stools.    Historical Provider, MD  metaxalone (SKELAXIN) 800 MG tablet TAKE ONE TABLET (800 MG TOTAL) BY MOUTH 3 (THREE) TIMES A DAY. 02/24/15   Historical Provider, MD  oxyCODONE-acetaminophen (PERCOCET) 10-325 MG per tablet TAKE 1 TABLET BY MOUTH 3 TIMES A DAY AS NEEDED FOR PAIN 03/28/15   Historical Provider, MD   BP 142/98 mmHg  Pulse 84  Temp(Src) 98.1 F (36.7 C) (Oral)  Resp 16  Ht 6' (1.829 m)  Wt 80.74 kg  BMI 24.14 kg/m2  SpO2 100% Physical Exam  Constitutional: He is oriented to person, place, and time. Vital signs are normal. He appears well-developed and well-nourished.  Non-toxic appearance. No distress.  Afebrile, nontoxic, NAD  HENT:  Head: Normocephalic and atraumatic.  Mouth/Throat: Mucous membranes are normal.  Eyes: Conjunctivae and EOM are normal. Right eye exhibits no discharge. Left eye exhibits no discharge.  Neck: Normal range of motion. Neck supple. Muscular tenderness present. No spinous process tenderness present. No rigidity. Normal range of motion present.    FROM intact without spinous process TTP, no bony stepoffs or deformities, with mild L sided paraspinous muscle TTP and muscle spasms. No rigidity or meningeal signs. No bruising or swelling.    Cardiovascular: Normal rate and intact distal pulses.   Pulmonary/Chest: Effort normal. No respiratory distress.  Abdominal: Normal appearance. He exhibits no distension.  Musculoskeletal: Normal range of motion.  MAE x4 Strength and sensation grossly intact Distal pulses intact Gait steady C-spine as above. All other spinal levels nonTTP without focal bony step offs or deformities.  Neurological: He is alert and oriented to person, place, and time. He has normal strength. No sensory deficit.  Skin: Skin is warm, dry and intact. No rash noted.  Psychiatric: He has a normal mood and affect.  Nursing note and vitals reviewed.   ED Course  Procedures (including critical care time) Labs Review Labs Reviewed - No data to display  Imaging Review No results found.  Study Result MRI Cervical Spine 08/15/14    CLINICAL DATA: 45 year old male with neck pain radiating to the left upper extremity intermittently need for 2 years. Numbness in the left thumb and index finger more recently. Cervical radiculopathy. Subsequent encounter.  EXAM: MRI CERVICAL SPINE WITHOUT CONTRAST  TECHNIQUE: Multiplanar, multisequence MR imaging of the cervical spine was performed. No intravenous contrast was administered.  COMPARISON: [facility] Cervical spine MRI 05/24/2013.  FINDINGS: Stable cervical vertebral height and alignment. No marrow edema or evidence of acute osseous abnormality. Suspect a small benign vertebral body hemangioma on the right at C6, unchanged.  Cervicomedullary junction is within normal limits. Spinal cord signal is within normal limits at all visualized levels.  Negative paraspinal soft tissues.  C2-C3: Stable and negative.  C3-C4: Stable mild disc bulge and uncovertebral hypertrophy. No spinal stenosis. Mild C4 foraminal stenosis is stable.  C4-C5: Mild disc bulge. Small central disc protrusion appears stable to mildly  increased. Borderline to mild spinal stenosis with no spinal cord mass effect. Mild uncovertebral hypertrophy. Mild to moderate C5 foraminal stenosis appears increased.  C5-C6: Mild circumferential disc bulge and uncovertebral hypertrophy. No spinal stenosis. Minimal to mild left and moderate right C6 foraminal stenosis are stable to mildly increased.  C6-C7: Mild disc bulge. No spinal stenosis. Minimal to mild C7 foraminal stenosis is unchanged.  C7-T1: Stable mild facet hypertrophy. No significant stenosis.  No upper thoracic spinal stenosis.  IMPRESSION: 1. Mild disc bulging and/or small central disc protrusions from C3-C4 to C6-C7. That at C4-C5 appears mildly progressed since 2014, with borderline to mild spinal stenosis and mild to moderate C5 foraminal stenosis. No spinal cord mass effect or signal abnormality. 2. Stable to mildly increased mild to moderate C6 foraminal stenosis greater on the right. 3. Other cervical spine levels appear stable   Electronically Signed  By: Augusto Gamble M.D.  On: 08/15/2014 14:53    I have personally reviewed and evaluated these images and lab results as part of my medical decision-making.   EKG Interpretation None      MDM   Final diagnoses:  Neck pain on left side  Muscle spasms of neck  Paresthesia of arm    45 y.o. male here with acute on chronic neck pain. Tenderness and spasm to L neck, MRI in the past has shown disc herniation and foraminal stenosis. NVI in all extremities, doubt need for emergent repeat MRI today. Pt has referral to neurosurgeon. Needs improved pain control. Will give toradol IM and valium which has helped in the past. Will give percocet here (hasn't had it in 4hrs, so he can take another dose now). Discussed f/up with neurosurgeon as referred by his PCP. Pt states prednisone hasn't helped in the past, doesn't want to take this now. Will d/c home with valium, refill of percocet, and naprosyn. F/up  with neurosurgeon this week. I explained the diagnosis and have given explicit precautions to return to the ER including for any other new or worsening symptoms. The patient understands and accepts the medical plan as it's been dictated and I have answered their questions. Discharge instructions concerning home care and prescriptions have been given. The patient is STABLE and is discharged to home in good condition.  BP 142/98 mmHg  Pulse 84  Temp(Src) 98.1 F (36.7 C) (Oral)  Resp 16  Ht 6' (1.829 m)  Wt 80.74 kg  BMI 24.14 kg/m2  SpO2 100%  Meds ordered this encounter  Medications  . venlafaxine (EFFEXOR) 75 MG tablet    Sig: Take 75 mg by mouth 2 (two) times daily.  Marland Kitchen ketorolac (TORADOL) 30 MG/ML injection 60 mg    Sig:   . diazepam (VALIUM) tablet 5 mg    Sig:   . oxyCODONE-acetaminophen (PERCOCET/ROXICET) 5-325 MG per tablet 1 tablet    Sig:   . diazepam (VALIUM) 5 MG tablet    Sig: Take 1 tablet (5  mg total) by mouth every 6 (six) hours as needed for anxiety (spasms).    Dispense:  10 tablet    Refill:  0    Order Specific Question:  Supervising Provider    Answer:  Hyacinth Meeker, BRIAN [[id number]]  . oxyCODONE-acetaminophen (PERCOCET) 5-325 MG tablet    Sig: Take 1 tablet by mouth every 6 (six) hours as needed for severe pain.    Dispense:  15 tablet    Refill:  0    Order Specific Question:  Supervising Provider    Answer:  MILLER, BRIAN [[id number]]  . naproxen (NAPROSYN) 500 MG tablet    Sig: Take 1 tablet (500 mg total) by mouth 2 (two) times daily as needed for mild pain, moderate pain or headache (TAKE WITH MEALS.).    Dispense:  20 tablet    Refill:  0    Order Specific Question:  Supervising Provider    Answer:  Eber Hong [[id number]]       Ellajane Stong Camprubi-Soms, PA-C 09/04/15 1313  Melene Plan, DO 09/04/15 1316

## 2015-09-04 NOTE — ED Notes (Signed)
PA at bedside for patient assessment

## 2015-09-04 NOTE — ED Notes (Signed)
Neck pain for a week. Spasms in his left side of his neck and knots per pt. He has been seen by an ortho specialist in the past for same. He left his MD's office after being referred to a Retail buyer. He is here for pain control.

## 2015-09-04 NOTE — ED Notes (Signed)
Pt waiting in lobby for wife.

## 2015-09-04 NOTE — Discharge Instructions (Signed)
Neck Pain: Your neck pain should be treated with medicines such as ibuprofen or aleve and this back pain should get better over the next 2 weeks.  However if you develop severe or worsening pain, low back pain with fever, numbness, weakness or inability to walk or urinate, you should return to the ER immediately.  Please follow up with your doctor this week for a recheck if still having symptoms.  Avoid heavy lifting over 10 pounds over the next two weeks.  Neck/back pain is discomfort in the back that may be due to injuries to muscles and ligaments around the spine.  Occasionally, it may be caused by a a problem to a part of the spine called a disc.  The pain may last several days or a week;  However, most patients get completely well in 4 weeks.  Self - care:  The application of heat can help soothe the pain.  Maintaining your daily activities, including walking, is encourged, as it will help you get better faster than just staying in bed. Perform gentle stretching as discussed. Drink plenty of fluids.  Medications are also useful to help with pain control.  A commonly prescribed medication includes percocet.  Do not drive or operate heavy machinery while taking this medication.  Non steroidal anti inflammatory medications including Ibuprofen and naproxen;  These medications help both pain and swelling and are very useful in treating back pain.  They should be taken with food, as they can cause stomach upset, and more seriously, stomach bleeding.    Muscle relaxants (valium):  These medications can help with muscle tightness that is a cause of lower back pain.  Most of these medications can cause drowsiness, and it is not safe to drive or use dangerous machinery while taking them.  SEEK IMMEDIATE MEDICAL ATTENTION IF: New numbness, tingling, weakness, or problem with the use of your arms or legs.  Severe back pain not relieved with medications.  Difficulty with or loss of control of your bowel or  bladder control.  Increasing pain in any areas of the body (such as chest or abdominal pain).  Shortness of breath, dizziness or fainting.  Nausea (feeling sick to your stomach), vomiting, fever, or sweats.  You will need to follow up with  Your primary healthcare provider in 1-2 weeks for reassessment, and with the neurosurgeon that you're referred to.

## 2015-09-12 ENCOUNTER — Encounter (HOSPITAL_COMMUNITY): Payer: Self-pay | Admitting: Emergency Medicine

## 2015-09-12 ENCOUNTER — Emergency Department (HOSPITAL_COMMUNITY)
Admission: EM | Admit: 2015-09-12 | Discharge: 2015-09-12 | Disposition: A | Discharge status: Home / Self Care | Payer: BLUE CROSS/BLUE SHIELD | Source: Home / Self Care | Attending: Family Medicine | Admitting: Family Medicine

## 2015-09-12 DIAGNOSIS — M5090 Cervical disc disorder, unspecified, unspecified cervical region: Secondary | ICD-10-CM | POA: Diagnosis not present

## 2015-09-12 MED ORDER — OXYCODONE-ACETAMINOPHEN 5-325 MG PO TABS: 2.0000 | ORAL_TABLET | Freq: Four times a day (QID) | ORAL | Status: DC | PRN

## 2015-09-12 MED FILL — OXYCODONE/APAP 5/325MG: 5-325 | 2 days supply | Qty: 12 | Fill #0

## 2015-09-12 NOTE — ED Notes (Signed)
Patient has existing complaints related to neck pain, back pain and numbness.  Onset of symptoms 7 days ago.  Patient seen at [facility] facility on [facility] approx one week ago.  Patient is scheduled to see neuro surgeon tomorrow.  Patient reports he is unable to tolerate current pain until then

## 2015-09-12 NOTE — ED Provider Notes (Signed)
CSN: [id number]     Arrival date & time 09/12/15  1406 History   First MD Initiated Contact with Patient 09/12/15 1635     Chief Complaint  Patient presents with  . Neck Pain   (Consider location/radiation/quality/duration/timing/severity/associated sxs/prior Treatment) HPI  Past Medical History  Diagnosis Date  . Pinched nerve     cervical  . DDD (degenerative disc disease), cervical   . Epididymal cyst     RIGHT   Past Surgical History  Procedure Laterality Date  . Reconstruction mandible  age 37    correct bite bilateral upper  . Removal lipoma, back  03/ 2014  . Removal hardware right upper mandible  04/ 2015  . Spermatocelectomy Right 03/18/2014    Procedure: RIGHT SPERMATOCELECTOMY;  Surgeon: Crist Fat, MD;  Location: [facility];  Service: Urology;  Laterality: Right;   No family history on file. Social History  Substance Use Topics  . Smoking status: Never Smoker   . Smokeless tobacco: Former Neurosurgeon    Types: Snuff    Quit date: 01/09/2014  . Alcohol Use: Yes     Comment: occasional    Review of Systems  Allergies  Review of patient's allergies indicates no known allergies.  Home Medications   Prior to Admission medications   Medication Sig Start Date End Date Taking? Authorizing Provider  oxyCODONE-acetaminophen (PERCOCET) 5-325 MG tablet Take 1 tablet by mouth every 6 (six) hours as needed for severe pain. 09/04/15  Yes Mercedes Camprubi-Soms, PA-C  diazepam (VALIUM) 5 MG tablet Take 1 tablet (5 mg total) by mouth every 6 (six) hours as needed for anxiety (spasms). 04/10/15   Gilda Crease, MD  diazepam (VALIUM) 5 MG tablet Take 1 tablet (5 mg total) by mouth every 6 (six) hours as needed for anxiety (spasms). 09/04/15   Mercedes Camprubi-Soms, PA-C  gabapentin (NEURONTIN) 600 MG tablet Take 600 mg by mouth 3 (three) times daily.     Historical Provider, MD  ibuprofen (ADVIL,MOTRIN) 200 MG tablet Take 400 mg by mouth every 6 (six)  hours as needed for moderate pain.    Historical Provider, MD  loperamide (IMODIUM A-D) 2 MG tablet Take 2 mg by mouth 4 (four) times daily as needed for diarrhea or loose stools.    Historical Provider, MD  metaxalone (SKELAXIN) 800 MG tablet TAKE ONE TABLET (800 MG TOTAL) BY MOUTH 3 (THREE) TIMES A DAY. 02/24/15   Historical Provider, MD  naproxen (NAPROSYN) 500 MG tablet Take 1 tablet (500 mg total) by mouth 2 (two) times daily as needed for mild pain, moderate pain or headache (TAKE WITH MEALS.). 09/04/15   Mercedes Camprubi-Soms, PA-C  oxyCODONE-acetaminophen (PERCOCET/ROXICET) 5-325 MG tablet Take 2 tablets by mouth every 6 (six) hours as needed for moderate pain or severe pain. 09/12/15   Tharon Aquas, PA  venlafaxine (EFFEXOR) 75 MG tablet Take 75 mg by mouth 2 (two) times daily.    Historical Provider, MD   Meds Ordered and Administered this Visit  Medications - No data to display  BP 137/95 mmHg  Pulse 71  Temp(Src) 97.8 F (36.6 C) (Oral)  Resp 16  SpO2 100% No data found.   Physical Exam  Constitutional: He appears well-developed and well-nourished.  HENT:  Head: Normocephalic.  Right Ear: External ear normal.  Left Ear: External ear normal.  Mouth/Throat: Oropharynx is clear and moist.  Neck: Full passive range of motion without pain. Spinous process tenderness and muscular tenderness present.  There is  some tenderness to the  cervical spine examination but, mostly muscular pain. Limited range of motion. The patient states this is long standing  Pulmonary/Chest: Effort normal.  Nursing note and vitals reviewed.   ED Course  Procedures (including critical care time)  Labs Review Labs Reviewed - No data to display  Imaging Review No results found.   Visual Acuity Review  Right Eye Distance:   Left Eye Distance:   Bilateral Distance:    Right Eye Near:   Left Eye Near:    Bilateral Near:        Review of MRI done in January 2016 patient has multiple  levels of disc protrusion. None of the areas appeared operable last year. Patient states he does have a cervical collar at home that he is happy to wear. Patient states that he has appointment with neurosurgery tomorrow morning he is also advised to return if there are. MDM   1. Cervical neck pain with evidence of disc disease   Patient is advised to continue home symptomatic treatment. Prescription for Percocet sent pharmacy patient has indicated. Patient is advised that if there are new or worsening symptoms or attend the emergency department, or contact primary care provider. Instructions of care provided discharged home in stable condition. Return to work/school note provided.  THIS NOTE WAS GENERATED USING A VOICE RECOGNITION SOFTWARE PROGRAM. ALL REASONABLE EFFORTS  WERE MADE TO PROOFREAD THIS DOCUMENT FOR ACCURACY.     Tharon Aquas, PA 09/12/15 1740

## 2015-09-12 NOTE — Discharge Instructions (Signed)
See Neurosurgeon tomorrow as scheduled  Take medication as directed.  Return if there are new or worsening of symptoms

## 2016-01-04 ENCOUNTER — Encounter (HOSPITAL_COMMUNITY): Payer: Self-pay | Admitting: Emergency Medicine

## 2016-01-04 ENCOUNTER — Emergency Department (HOSPITAL_COMMUNITY)
Admission: EM | Admit: 2016-01-04 | Discharge: 2016-01-04 | Disposition: A | Discharge status: Home / Self Care | Payer: BLUE CROSS/BLUE SHIELD | Attending: Emergency Medicine | Admitting: Emergency Medicine

## 2016-01-04 DIAGNOSIS — G8929 Other chronic pain: Secondary | ICD-10-CM | POA: Diagnosis not present

## 2016-01-04 DIAGNOSIS — R110 Nausea: Secondary | ICD-10-CM | POA: Insufficient documentation

## 2016-01-04 DIAGNOSIS — M542 Cervicalgia: Secondary | ICD-10-CM | POA: Insufficient documentation

## 2016-01-04 MED ORDER — DIAZEPAM 5 MG PO TABS: 5.0000 mg | ORAL_TABLET | Freq: Four times a day (QID) | ORAL | Status: DC | PRN

## 2016-01-04 MED ORDER — KETOROLAC TROMETHAMINE 30 MG/ML IJ SOLN
30.0000 mg | Freq: Once | INTRAMUSCULAR | Status: AC
  Administered 2016-01-04: 30 mg via INTRAMUSCULAR
  Filled 2016-01-04: qty 1

## 2016-01-04 MED ORDER — HYDROMORPHONE HCL 1 MG/ML IJ SOLN
1.0000 mg | Freq: Once | INTRAMUSCULAR | Status: AC
  Administered 2016-01-04: 1 mg via INTRAMUSCULAR
  Filled 2016-01-04: qty 1

## 2016-01-04 MED ORDER — DEXAMETHASONE SODIUM PHOSPHATE 10 MG/ML IJ SOLN
10.0000 mg | Freq: Once | INTRAMUSCULAR | Status: AC
  Administered 2016-01-04: 10 mg via INTRAMUSCULAR
  Filled 2016-01-04: qty 1

## 2016-01-04 NOTE — ED Provider Notes (Signed)
CSN: [id number]     Arrival date & time 01/04/16  1344 History  By signing my name below, I, Roger Gonzales, attest that this documentation has been prepared under the direction and in the presence of Santiago Glad, PA-C Electronically Signed: Charline Bills, ED Scribe 01/04/2016 at 3:30 PM.   Chief Complaint  Patient presents with  . Neck Pain  . Back Pain  . Nausea   The history is provided by the patient. No language interpreter was used.   HPI Comments: Roger Gonzales is a 45 y.o. male, with a h/o chronic neck and back pain, who presents to the Emergency Department complaining of acute on chronic neck pain that radiates into his back for the past 3 days, worsened since this morning. Pt describes neck pain as spasms that is exacerbated with movement. He denies injuries but suspects that this flare-up is due to playing tennis approximately 5 days ago. Pt reports associated nausea this morning with 1 episode of vomiting around 6 AM this morning that has resolved. He has tried 10 mg Percocet last night and again this morning without significant relief. Pt has also tried Prednisone in the past without significant relief. He denies fever, numbness/tingling, abdominal pain.   He states that he is followed by Pain Management for this pain.  Past Medical History  Diagnosis Date  . Pinched nerve     cervical  . DDD (degenerative disc disease), cervical   . Epididymal cyst     RIGHT   Past Surgical History  Procedure Laterality Date  . Reconstruction mandible  age 24    correct bite bilateral upper  . Removal lipoma, back  03/ 2014  . Removal hardware right upper mandible  04/ 2015  . Spermatocelectomy Right 03/18/2014    Procedure: RIGHT SPERMATOCELECTOMY;  Surgeon: Crist Fat, MD;  Location: [facility];  Service: Urology;  Laterality: Right;   No family history on file. Social History  Substance Use Topics  . Smoking status: Never Smoker   . Smokeless tobacco:  Former Neurosurgeon    Types: Snuff    Quit date: 01/09/2014  . Alcohol Use: Yes     Comment: occasional    Review of Systems  Constitutional: Negative for fever.  Gastrointestinal: Positive for nausea. Negative for vomiting (resolved) and abdominal pain.  Musculoskeletal: Positive for back pain and neck pain.  Neurological: Negative for numbness.  All other systems reviewed and are negative.  Allergies  Review of patient's allergies indicates no known allergies.  Home Medications   Prior to Admission medications   Medication Sig Start Date End Date Taking? Authorizing Provider  diazepam (VALIUM) 5 MG tablet Take 1 tablet (5 mg total) by mouth every 6 (six) hours as needed for anxiety (spasms). 04/10/15   Gilda Crease, MD  diazepam (VALIUM) 5 MG tablet Take 1 tablet (5 mg total) by mouth every 6 (six) hours as needed for anxiety (spasms). 09/04/15   Mercedes Camprubi-Soms, PA-C  gabapentin (NEURONTIN) 600 MG tablet Take 600 mg by mouth 3 (three) times daily.     Historical Provider, MD  ibuprofen (ADVIL,MOTRIN) 200 MG tablet Take 400 mg by mouth every 6 (six) hours as needed for moderate pain.    Historical Provider, MD  loperamide (IMODIUM A-D) 2 MG tablet Take 2 mg by mouth 4 (four) times daily as needed for diarrhea or loose stools.    Historical Provider, MD  metaxalone (SKELAXIN) 800 MG tablet TAKE ONE TABLET (800 MG TOTAL) BY  MOUTH 3 (THREE) TIMES A DAY. 02/24/15   Historical Provider, MD  naproxen (NAPROSYN) 500 MG tablet Take 1 tablet (500 mg total) by mouth 2 (two) times daily as needed for mild pain, moderate pain or headache (TAKE WITH MEALS.). 09/04/15   Mercedes Camprubi-Soms, PA-C  oxyCODONE-acetaminophen (PERCOCET) 5-325 MG tablet Take 1 tablet by mouth every 6 (six) hours as needed for severe pain. 09/04/15   Mercedes Camprubi-Soms, PA-C  oxyCODONE-acetaminophen (PERCOCET/ROXICET) 5-325 MG tablet Take 2 tablets by mouth every 6 (six) hours as needed for moderate pain or severe  pain. 09/12/15   Tharon AquasFrank C Patrick, PA  venlafaxine (EFFEXOR) 75 MG tablet Take 75 mg by mouth 2 (two) times daily.    Historical Provider, MD   BP 145/108 mmHg  Pulse 86  Temp(Src) 98.5 F (36.9 C) (Oral)  Resp 18  Ht 6\' 1"  (1.854 m)  Wt 180 lb (81.647 kg)  BMI 23.75 kg/m2  SpO2 97% Physical Exam  Constitutional: He is oriented to person, place, and time. He appears well-developed and well-nourished. No distress.  HENT:  Head: Normocephalic and atraumatic.  Eyes: Conjunctivae and EOM are normal.  Neck: Neck supple. No tracheal deviation present.  Cardiovascular: Normal rate, regular rhythm and normal heart sounds.   Pulses:      Radial pulses are 2+ on the right side, and 2+ on the left side.  Pulmonary/Chest: Effort normal and breath sounds normal. No respiratory distress.  Musculoskeletal: Normal range of motion.       Cervical back: He exhibits tenderness and bony tenderness. He exhibits normal range of motion, no swelling, no edema and no deformity.  Tenderness to palpation of the cervical spine 5/5 grip strength bilaterally Distal sensation of the hands intact bilaterally but slightly decreased on the L   Neurological: He is alert and oriented to person, place, and time.  Skin: Skin is warm and dry.  Psychiatric: He has a normal mood and affect. His behavior is normal.  Nursing note and vitals reviewed.  ED Course  Procedures (including critical care time) DIAGNOSTIC STUDIES: Oxygen Saturation is 97% on RA, normal by my interpretation.    COORDINATION OF CARE: 3:27 PM-Discussed treatment plan which includes Decadron injection and Toradol injection with pt at bedside and pt agreed to plan.   MDM   Final diagnoses:  None   Patient with a history of chronic cervical neck pain presents today with neck pain.  No acute injury or trauma.  Neurovascularly intact.  Normal grip strength.  He is afebrile.  Pain improved with IM Toradol, Dilaudid, and Decadron in the ED.  Do not  feel that any imaging is indicated at this time.  He reports having a MRI for this pain approximately 4 months ago.  Patient stable for discharge.  Return precautions given.  I personally performed the services described in this documentation, which was scribed in my presence. The recorded information has been reviewed and is accurate.     Santiago GladHeather Courtnie Brenes, PA-C 01/05/16 1619  Lavera Guiseana Duo Liu, MD 01/05/16 [phone]

## 2016-01-04 NOTE — ED Notes (Signed)
Called again with no answer.

## 2016-01-04 NOTE — ED Notes (Signed)
Called and no answer.  Will attempt recall later.

## 2016-01-04 NOTE — ED Notes (Signed)
Patient states chronic neck and back problems for 2 years.   Patient states he is with a pain management group, but he can't get in until next week.   Patient did have some nausea, but denies any vomiting.   Patient states he usually takes percocet, gabapentin and effexor for pain.   Patient states no new injuries.   Patient states his pain medications are not working.

## 2016-01-04 NOTE — ED Notes (Signed)
Pt stable, ambulatory, states understanding of discharge instructions 

## 2016-01-05 ENCOUNTER — Emergency Department (HOSPITAL_COMMUNITY)
Admission: EM | Admit: 2016-01-05 | Discharge: 2016-01-05 | Disposition: A | Discharge status: Home / Self Care | Payer: BLUE CROSS/BLUE SHIELD | Attending: Emergency Medicine | Admitting: Emergency Medicine

## 2016-01-05 ENCOUNTER — Encounter (HOSPITAL_COMMUNITY): Payer: Self-pay | Admitting: Emergency Medicine

## 2016-01-05 DIAGNOSIS — Z791 Long term (current) use of non-steroidal anti-inflammatories (NSAID): Secondary | ICD-10-CM | POA: Insufficient documentation

## 2016-01-05 DIAGNOSIS — M5030 Other cervical disc degeneration, unspecified cervical region: Secondary | ICD-10-CM | POA: Diagnosis not present

## 2016-01-05 DIAGNOSIS — M549 Dorsalgia, unspecified: Secondary | ICD-10-CM | POA: Diagnosis present

## 2016-01-05 DIAGNOSIS — M542 Cervicalgia: Secondary | ICD-10-CM | POA: Insufficient documentation

## 2016-01-05 MED ORDER — HYDROMORPHONE HCL 2 MG PO TABS: 2.0000 mg | ORAL_TABLET | Freq: Four times a day (QID) | ORAL | Status: DC | PRN

## 2016-01-05 MED ORDER — ONDANSETRON 4 MG PO TBDP
4.0000 mg | ORAL_TABLET | Freq: Once | ORAL | Status: AC
  Administered 2016-01-05: 4 mg via ORAL
  Filled 2016-01-05: qty 1

## 2016-01-05 MED ORDER — HYDROMORPHONE HCL 1 MG/ML IJ SOLN
1.0000 mg | INTRAMUSCULAR | Status: DC | PRN
  Administered 2016-01-05: 1 mg via INTRAMUSCULAR

## 2016-01-05 MED ORDER — DEXAMETHASONE SODIUM PHOSPHATE 10 MG/ML IJ SOLN
10.0000 mg | Freq: Once | INTRAMUSCULAR | Status: AC
  Administered 2016-01-05: 10 mg via INTRAMUSCULAR
  Filled 2016-01-05: qty 1

## 2016-01-05 MED ORDER — HYDROMORPHONE HCL 1 MG/ML IJ SOLN
1.0000 mg | INTRAMUSCULAR | Status: DC | PRN
Start: 2016-01-05 — End: 2016-01-05
  Filled 2016-01-05: qty 1

## 2016-01-05 MED ORDER — KETOROLAC TROMETHAMINE 30 MG/ML IJ SOLN
30.0000 mg | Freq: Once | INTRAMUSCULAR | Status: AC
  Administered 2016-01-05: 30 mg via INTRAMUSCULAR
  Filled 2016-01-05: qty 1

## 2016-01-05 NOTE — ED Provider Notes (Signed)
CSN: [id number]     Arrival date & time 01/05/16  1527 History   First MD Initiated Contact with Patient 01/05/16 1946     Chief Complaint  Patient presents with  . Back Pain      HPI  Patient presents for evaluation of neck pain.  Social reports a history of neck pain over the last several years. He follows with a pain management physician at [facility]. He follows with Dr. Marikay Alar of neurosurgery here in [place].  His last MRI was in February and showed mild multilevel disease.  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 He has not worked for over year because of his symptoms. He still works for his employer, but does not fly.  He states his symptoms been worse for the last week. This is after he had a month of feeling better next to getting back to doing some activities such as tenderness.  He was seen yesterday. He is up front about that immediately. He states he feels "embarrassed" about being here. However he states that for the last 2 days he's had some unrelenting pain and been unable to sleep.  He denies any radiation of pain. He has no lower urinary symptoms. He has normal bowel bladder habits have been unchanged. He is not retaining. He has normal sensation. He's had a bowel movement and urinated today.  No numbness weakness or tingling to his arms. No fall or injury or trauma.      Past Medical History  Diagnosis Date  . Pinched nerve     cervical  . DDD (degenerative disc disease), cervical   . Epididymal cyst     RIGHT   Past Surgical History  Procedure Laterality Date  . Reconstruction mandible  age 61    correct bite bilateral upper  . Removal lipoma, back  03/ 2014  . Removal hardware right upper mandible  04/ 2015  . Spermatocelectomy Right 03/18/2014    Procedure: RIGHT SPERMATOCELECTOMY;  Surgeon: Crist Fat, MD;  Location: [facility];  Service: Urology;  Laterality: Right;   No family history on file. Social History  Substance  Use Topics  . Smoking status: Never Smoker   . Smokeless tobacco: Current User    Types: Snuff    Last Attempt to Quit: 01/09/2014  . Alcohol Use: Yes     Comment: occasional    Review of Systems  Constitutional: Negative for fever, chills, diaphoresis, appetite change and fatigue.  HENT: Negative for mouth sores, sore throat and trouble swallowing.   Eyes: Negative for visual disturbance.  Respiratory: Negative for cough, chest tightness, shortness of breath and wheezing.   Cardiovascular: Negative for chest pain.  Gastrointestinal: Negative for nausea, vomiting, abdominal pain, diarrhea and abdominal distention.  Endocrine: Negative for polydipsia, polyphagia and polyuria.  Genitourinary: Negative for dysuria, frequency and hematuria.  Musculoskeletal: Positive for neck pain. Negative for gait problem.  Skin: Negative for color change, pallor and rash.  Neurological: Negative for dizziness, syncope, light-headedness and headaches.  Hematological: Does not bruise/bleed easily.  Psychiatric/Behavioral: Negative for behavioral problems and confusion.      Allergies  Review of patient's allergies indicates no known allergies.  Home Medications   Prior to Admission medications   Medication Sig Start Date End Date Taking? Authorizing Provider  diazepam (VALIUM) 5 MG tablet Take 1 tablet (5 mg total) by mouth every 6 (six) hours as needed for anxiety. 01/04/16   Heather Laisure, PA-C  gabapentin (NEURONTIN) 600  MG tablet Take 600 mg by mouth 3 (three) times daily.     Historical Provider, MD  HYDROmorphone (DILAUDID) 2 MG tablet Take 1 tablet (2 mg total) by mouth every 6 (six) hours as needed for moderate pain. 01/05/16   Rolland Porter, MD  ibuprofen (ADVIL,MOTRIN) 200 MG tablet Take 400 mg by mouth every 6 (six) hours as needed for moderate pain.    Historical Provider, MD  loperamide (IMODIUM A-D) 2 MG tablet Take 2 mg by mouth 4 (four) times daily as needed for diarrhea or loose  stools.    Historical Provider, MD  metaxalone (SKELAXIN) 800 MG tablet TAKE ONE TABLET (800 MG TOTAL) BY MOUTH 3 (THREE) TIMES A DAY. 02/24/15   Historical Provider, MD  naproxen (NAPROSYN) 500 MG tablet Take 1 tablet (500 mg total) by mouth 2 (two) times daily as needed for mild pain, moderate pain or headache (TAKE WITH MEALS.). 09/04/15   Mercedes Camprubi-Soms, PA-C  oxyCODONE-acetaminophen (PERCOCET) 5-325 MG tablet Take 1 tablet by mouth every 6 (six) hours as needed for severe pain. 09/04/15   Mercedes Camprubi-Soms, PA-C  oxyCODONE-acetaminophen (PERCOCET/ROXICET) 5-325 MG tablet Take 2 tablets by mouth every 6 (six) hours as needed for moderate pain or severe pain. 09/12/15   Tharon Aquas, PA  venlafaxine (EFFEXOR) 75 MG tablet Take 75 mg by mouth 2 (two) times daily.    Historical Provider, MD   BP 149/96 mmHg  Pulse 88  Temp(Src) 98.3 F (36.8 C) (Oral)  Resp 20  Ht  (1.854 m)  Wt 180 lb (81.647 kg)  BMI 23.75 kg/m2  SpO2 100% Physical Exam  Constitutional: He is oriented to person, place, and time. He appears well-developed and well-nourished. No distress.  HENT:  Head: Normocephalic.  Eyes: Conjunctivae are normal. Pupils are equal, round, and reactive to light. No scleral icterus.  Neck: Normal range of motion. Neck supple. No thyromegaly present.  Describes more left-sided neck pain. He has no limitation of range of motion.  Cardiovascular: Normal rate and regular rhythm.  Exam reveals no gallop and no friction rub.   No murmur heard. Pulmonary/Chest: Effort normal and breath sounds normal. No respiratory distress. He has no wheezes. He has no rales.  Abdominal: Soft. Bowel sounds are normal. He exhibits no distension. There is no tenderness. There is no rebound.  Musculoskeletal: Normal range of motion.  Neurological: He is alert and oriented to person, place, and time.  Normal symmetric Strength to shoulder shrug, triceps, biceps, grip,wrist flex/extend,and  intrinsics  Norma lsymmetric sensation above and below clavicles, and to all distributions to UEs. Norma symmetric strength to flex/.extend hip and knees, dorsi/plantar flex ankles. Normal symmetric sensation to all distributions to LEs Patellar and achilles reflexes 1-2+. Downgoing Babinski   Skin: Skin is warm and dry. No rash noted.  Psychiatric: He has a normal mood and affect. His behavior is normal.    ED Course  Procedures (including critical care time) Labs Review Labs Reviewed - No data to display  Imaging Review No results found. I have personally reviewed and evaluated these images and lab results as part of my medical decision-making.   EKG Interpretation None      MDM   Final diagnoses:  Neck pain    Patient with a quite believable story. In reviewing the database he is not receiving prescription just that his pain management physician. He has followed up with his pain management physician. Local neurosurgery. He does not have findings suggestive of acute  herniated. Was given occasions here. We'll add Medrol Dosepak.  Follow up with his neurosurgeon as well as his pain management physician. A follow-up appointment Monday morning.    Rolland PorterMark Donavin Audino, MD 01/05/16 2121

## 2016-01-05 NOTE — Discharge Instructions (Signed)
Continue your current medications.  Follow-up with your pain management doctor on Monday morning

## 2016-01-05 NOTE — ED Notes (Signed)
Pt was seen at [facility] yesterday for complications regarding chronic back pain caused by cervical disc disease.  Patient was given injections and pt reports getting up this morning feeling worse than he did yesterday.  Pt's pain management doctor, Dr. Fredrik RiggerKepural, is unable to see him until Friday due to doctor availability. States he is unable to make it through the weekend with this pain.  Pt ambulatory at this time.

## 2016-01-08 ENCOUNTER — Emergency Department (HOSPITAL_COMMUNITY)
Admission: EM | Admit: 2016-01-08 | Discharge: 2016-01-08 | Disposition: A | Discharge status: Home / Self Care | Payer: BLUE CROSS/BLUE SHIELD | Attending: Emergency Medicine | Admitting: Emergency Medicine

## 2016-01-08 ENCOUNTER — Encounter (HOSPITAL_COMMUNITY): Payer: Self-pay | Admitting: Emergency Medicine

## 2016-01-08 DIAGNOSIS — M542 Cervicalgia: Secondary | ICD-10-CM | POA: Diagnosis present

## 2016-01-08 DIAGNOSIS — F17290 Nicotine dependence, other tobacco product, uncomplicated: Secondary | ICD-10-CM | POA: Diagnosis not present

## 2016-01-08 DIAGNOSIS — R200 Anesthesia of skin: Secondary | ICD-10-CM | POA: Insufficient documentation

## 2016-01-08 DIAGNOSIS — M25512 Pain in left shoulder: Secondary | ICD-10-CM | POA: Diagnosis not present

## 2016-01-08 DIAGNOSIS — M5030 Other cervical disc degeneration, unspecified cervical region: Secondary | ICD-10-CM | POA: Insufficient documentation

## 2016-01-08 DIAGNOSIS — G8929 Other chronic pain: Secondary | ICD-10-CM | POA: Diagnosis not present

## 2016-01-08 DIAGNOSIS — Z79899 Other long term (current) drug therapy: Secondary | ICD-10-CM | POA: Insufficient documentation

## 2016-01-08 MED ORDER — HYDROMORPHONE HCL 1 MG/ML IJ SOLN
1.0000 mg | Freq: Once | INTRAMUSCULAR | Status: AC
  Administered 2016-01-08: 1 mg via INTRAMUSCULAR
  Filled 2016-01-08: qty 1

## 2016-01-08 NOTE — Discharge Instructions (Signed)
Chronic Pain Chronic pain can be defined as pain that is off and on and lasts for 3-6 months or longer. Many things cause chronic pain, which can make it difficult to make a diagnosis. There are many treatment options available for chronic pain. However, finding a treatment that works well for you may require trying various approaches until the right one is found. Many people benefit from a combination of two or more types of treatment to control their pain. SYMPTOMS  Chronic pain can occur anywhere in the body and can range from mild to very severe. Some types of chronic pain include:  Headache.  Low back pain.  Cancer pain.  Arthritis pain.  Neurogenic pain. This is pain resulting from damage to nerves. People with chronic pain may also have other symptoms such as:  Depression.  Anger.  Insomnia.  Anxiety. DIAGNOSIS  Your health care provider will help diagnose your condition over time. In many cases, the initial focus will be on excluding possible conditions that could be causing the pain. Depending on your symptoms, your health care provider may order tests to diagnose your condition. Some of these tests may include:   Blood tests.   CT scan.   MRI.   X-rays.   Ultrasounds.   Nerve conduction studies.  You may need to see a specialist.  TREATMENT  Finding treatment that works well may take time. You may be referred to a pain specialist. He or she may prescribe medicine or therapies, such as:   Mindful meditation or yoga.  Shots (injections) of numbing or pain-relieving medicines into the spine or area of pain.  Local electrical stimulation.  Acupuncture.   Massage therapy.   Aroma, color, light, or sound therapy.   Biofeedback.   Working with a physical therapist to keep from getting stiff.   Regular, gentle exercise.   Cognitive or behavioral therapy.   Group support.  Sometimes, surgery may be recommended.  HOME CARE INSTRUCTIONS    Take all medicines as directed by your health care provider.   Lessen stress in your life by relaxing and doing things such as listening to calming music.   Exercise or be active as directed by your health care provider.   Eat a healthy diet and include things such as vegetables, fruits, fish, and lean meats in your diet.   Keep all follow-up appointments with your health care provider.   Attend a support group with others suffering from chronic pain. SEEK MEDICAL CARE IF:   Your pain gets worse.   You develop a new pain that was not there before.   You cannot tolerate medicines given to you by your health care provider.   You have new symptoms since your last visit with your health care provider.  SEEK IMMEDIATE MEDICAL CARE IF:   You feel weak.   You have decreased sensation or numbness.   You lose control of bowel or bladder function.   Your pain suddenly gets much worse.   You develop shaking.  You develop chills.  You develop confusion.  You develop chest pain.  You develop shortness of breath.  MAKE SURE YOU:  Understand these instructions.  Will watch your condition.  Will get help right away if you are not doing well or get worse.   This information is not intended to replace advice given to you by your health care provider. Make sure you discuss any questions you have with your health care provider.  Keep scheduled appointment with  your pain management clinic on Thursday. Please avoid obtaining prescribed pain medication from other providers as this will likely disrupt your pain contract. Take home pain medication for breakthrough pain. Return to the ED if you experience loss of control of your bowel and bladder, numbness/tingling in both of your lower extremities, fevers, chills,

## 2016-01-08 NOTE — ED Notes (Signed)
Per pt, states he has been seen multiple times for neck and back pain-chronic issue-seeing pain management MD in [place] and cant get in-sent here for pain management

## 2016-01-08 NOTE — ED Provider Notes (Signed)
CSN: [id number]     Arrival date & time 01/08/16  1144 History  By signing my name below, I, Soijett Blue, attest that this documentation has been prepared under the direction and in the presence of Gaylyn Rong, PA-C Electronically Signed: Soijett Blue, ED Scribe. 01/08/2016. 12:39 PM.   Chief Complaint  Patient presents with  . Neck Pain      The history is provided by the patient. No language interpreter was used.    HPI Comments: Roger Gonzales is a 45 y.o. male with a PMHx of DDD, who presents to the Emergency Department complaining of chronic posterior neck pain onset 2-3 days. Pt states that he has been seen in the ED 4-5 times within the last week for his recent flare up of his neck pain. Pt reports that he attempted to be seen by his pain management clinic and he was informed that he has an appointment with them in 3 days. Pt states that he was informed by his pain management clinic, that if the provider called the clinic and informed on what medications would be Rx, it would be okay to prescribe without ending his pain contract. Pt reports that his last pain medication dose was last night and it didn't alleviate his symptoms. Pt notes that he sees him neurologist, Dr. Yetta Barre and he was informed that he needed to have surgery to alleviate his symptoms. Pt has associated symptoms of left shoulder pain and chronic numbness to his left fingers. He states that he has tried 3000 mg gabapentin, oxycodone, and Effexor, with no relief for his symptoms. He denies any other symptoms.   Past Medical History  Diagnosis Date  . Pinched nerve     cervical  . DDD (degenerative disc disease), cervical   . Epididymal cyst     RIGHT   Past Surgical History  Procedure Laterality Date  . Reconstruction mandible  age 16    correct bite bilateral upper  . Removal lipoma, back  03/ 2014  . Removal hardware right upper mandible  04/ 2015  . Spermatocelectomy Right 03/18/2014    Procedure: RIGHT  SPERMATOCELECTOMY;  Surgeon: Crist Fat, MD;  Location: [facility];  Service: Urology;  Laterality: Right;   No family history on file. Social History  Substance Use Topics  . Smoking status: Never Smoker   . Smokeless tobacco: Current User    Types: Snuff    Last Attempt to Quit: 01/09/2014  . Alcohol Use: Yes     Comment: occasional    Review of Systems  Musculoskeletal: Positive for arthralgias (left shoulder) and neck pain.  Skin: Negative for color change, rash and wound.  Neurological: Positive for numbness (chronic to left fingers).  All other systems reviewed and are negative.     Allergies  Review of patient's allergies indicates no known allergies.  Home Medications   Prior to Admission medications   Medication Sig Start Date End Date Taking? Authorizing Provider  diazepam (VALIUM) 5 MG tablet Take 1 tablet (5 mg total) by mouth every 6 (six) hours as needed for anxiety. 01/04/16   Heather Laisure, PA-C  gabapentin (NEURONTIN) 600 MG tablet Take 600 mg by mouth 3 (three) times daily.     Historical Provider, MD  HYDROmorphone (DILAUDID) 2 MG tablet Take 1 tablet (2 mg total) by mouth every 6 (six) hours as needed for moderate pain. 01/05/16   Rolland Porter, MD  ibuprofen (ADVIL,MOTRIN) 200 MG tablet Take 400 mg by mouth every  6 (six) hours as needed for moderate pain.    Historical Provider, MD  loperamide (IMODIUM A-D) 2 MG tablet Take 2 mg by mouth 4 (four) times daily as needed for diarrhea or loose stools.    Historical Provider, MD  metaxalone (SKELAXIN) 800 MG tablet TAKE ONE TABLET (800 MG TOTAL) BY MOUTH 3 (THREE) TIMES A DAY. 02/24/15   Historical Provider, MD  naproxen (NAPROSYN) 500 MG tablet Take 1 tablet (500 mg total) by mouth 2 (two) times daily as needed for mild pain, moderate pain or headache (TAKE WITH MEALS.). 09/04/15   Mercedes Camprubi-Soms, PA-C  oxyCODONE-acetaminophen (PERCOCET) 5-325 MG tablet Take 1 tablet by mouth every 6  (six) hours as needed for severe pain. 09/04/15   Mercedes Camprubi-Soms, PA-C  oxyCODONE-acetaminophen (PERCOCET/ROXICET) 5-325 MG tablet Take 2 tablets by mouth every 6 (six) hours as needed for moderate pain or severe pain. 09/12/15   Tharon AquasFrank C Patrick, PA  venlafaxine (EFFEXOR) 75 MG tablet Take 75 mg by mouth 2 (two) times daily.    Historical Provider, MD   BP 141/92 mmHg  Pulse 91  Temp(Src) 98.1 F (36.7 C) (Oral)  Resp 18  Ht 6\' 1"  (1.854 m)  Wt 180 lb (81.647 kg)  BMI 23.75 kg/m2  SpO2 100% Physical Exam  Constitutional: He is oriented to person, place, and time. He appears well-developed and well-nourished. No distress.  HENT:  Head: Normocephalic and atraumatic.  Eyes: Conjunctivae are normal. Right eye exhibits no discharge. Left eye exhibits no discharge. No scleral icterus.  Cardiovascular: Normal rate.   Pulmonary/Chest: Effort normal.  Musculoskeletal:       Cervical back: He exhibits tenderness. He exhibits normal range of motion and no deformity.  TTP of midline cervical spine and left cervical paraspinal muscles and left trapezius with nl ROM. no step-offs or obvious bony deformities.   Neurological: He is alert and oriented to person, place, and time. Coordination normal.  Skin: Skin is warm and dry. No rash noted. He is not diaphoretic. No erythema. No pallor.  Psychiatric: He has a normal mood and affect. His behavior is normal.  Nursing note and vitals reviewed.   ED Course  Procedures (including critical care time) DIAGNOSTIC STUDIES: Oxygen Saturation is 100% on RA, nl by my interpretation.    COORDINATION OF CARE: 12:25 PM Discussed treatment plan with pt at bedside which includes consult to pain management clinic and pt agreed to plan.  12:50 PM- Consult with [facility], in regards to the narcotic prescription policy and was informed that the pt can not receive narcotic prescriptions, but can receives doses in the ED.   Labs Review Labs  Reviewed - No data to display  Imaging Review No results found.    EKG Interpretation None      MDM   Final diagnoses:  Chronic neck pain    Patient presents to the ED with complaints of exacerbation of chronic neck pain. He states that he is under pain contract but currently the medication he is taking is not controlling his pain. His prescription for oxycodone, gabapentin and Effexor. Patient presents requesting a prescription for additional pain medication to get him to his appointment which is this Thursday. Verified with [facility] that pt has an upcoming appointment this Thursday, 01/11/2016 at 10 AM. I spoke with Denny PeonErin, Dr.Keprual's nurse practitioner who states that they do not want patient to receive any pain medications from an outside provider. He has been inconsistent on his UDS screens  and has been seen to have early refills. I discussed this with patient who expressed understanding. We'll provide dose of pain medication in the ED but will not provide a prescription. Return precautions outlined in patient discharge instructions.  I personally performed the services described in this documentation, which was scribed in my presence. The recorded information has been reviewed and is accurate.     Lester Kinsman Cardiff, PA-C 01/08/16 1327  Gerhard Munch, MD 01/08/16 [phone]

## 2016-01-25 ENCOUNTER — Encounter (HOSPITAL_COMMUNITY): Payer: Self-pay | Admitting: Emergency Medicine

## 2016-01-25 ENCOUNTER — Emergency Department (HOSPITAL_COMMUNITY)
Admission: EM | Admit: 2016-01-25 | Discharge: 2016-01-25 | Disposition: A | Discharge status: Home / Self Care | Payer: BLUE CROSS/BLUE SHIELD | Attending: Emergency Medicine | Admitting: Emergency Medicine

## 2016-01-25 DIAGNOSIS — F17290 Nicotine dependence, other tobacco product, uncomplicated: Secondary | ICD-10-CM | POA: Diagnosis not present

## 2016-01-25 DIAGNOSIS — Z79899 Other long term (current) drug therapy: Secondary | ICD-10-CM | POA: Diagnosis not present

## 2016-01-25 DIAGNOSIS — G894 Chronic pain syndrome: Secondary | ICD-10-CM | POA: Diagnosis not present

## 2016-01-25 DIAGNOSIS — G8929 Other chronic pain: Secondary | ICD-10-CM

## 2016-01-25 MED ORDER — HYDROMORPHONE HCL 2 MG/ML IJ SOLN
2.0000 mg | Freq: Once | INTRAMUSCULAR | Status: AC
  Administered 2016-01-25: 2 mg via INTRAMUSCULAR
  Filled 2016-01-25: qty 1

## 2016-01-25 NOTE — Discharge Instructions (Signed)
All of your pain control should be managed by a pain management doctor. You cannot expect to continue to receive treatment for pain, when you come to the emergency department.  Chronic Pain Chronic pain can be defined as pain that is off and on and lasts for 3-6 months or longer. Many things cause chronic pain, which can make it difficult to make a diagnosis. There are many treatment options available for chronic pain. However, finding a treatment that works well for you may require trying various approaches until the right one is found. Many people benefit from a combination of two or more types of treatment to control their pain. SYMPTOMS  Chronic pain can occur anywhere in the body and can range from mild to very severe. Some types of chronic pain include:  Headache.  Low back pain.  Cancer pain.  Arthritis pain.  Neurogenic pain. This is pain resulting from damage to nerves. People with chronic pain may also have other symptoms such as:  Depression.  Anger.  Insomnia.  Anxiety. DIAGNOSIS  Your health care provider will help diagnose your condition over time. In many cases, the initial focus will be on excluding possible conditions that could be causing the pain. Depending on your symptoms, your health care provider may order tests to diagnose your condition. Some of these tests may include:   Blood tests.   CT scan.   MRI.   X-rays.   Ultrasounds.   Nerve conduction studies.  You may need to see a specialist.  TREATMENT  Finding treatment that works well may take time. You may be referred to a pain specialist. He or she may prescribe medicine or therapies, such as:   Mindful meditation or yoga.  Shots (injections) of numbing or pain-relieving medicines into the spine or area of pain.  Local electrical stimulation.  Acupuncture.   Massage therapy.   Aroma, color, light, or sound therapy.   Biofeedback.   Working with a physical therapist to  keep from getting stiff.   Regular, gentle exercise.   Cognitive or behavioral therapy.   Group support.  Sometimes, surgery may be recommended.  HOME CARE INSTRUCTIONS   Take all medicines as directed by your health care provider.   Lessen stress in your life by relaxing and doing things such as listening to calming music.   Exercise or be active as directed by your health care provider.   Eat a healthy diet and include things such as vegetables, fruits, fish, and lean meats in your diet.   Keep all follow-up appointments with your health care provider.   Attend a support group with others suffering from chronic pain. SEEK MEDICAL CARE IF:   Your pain gets worse.   You develop a new pain that was not there before.   You cannot tolerate medicines given to you by your health care provider.   You have new symptoms since your last visit with your health care provider.  SEEK IMMEDIATE MEDICAL CARE IF:   You feel weak.   You have decreased sensation or numbness.   You lose control of bowel or bladder function.   Your pain suddenly gets much worse.   You develop shaking.  You develop chills.  You develop confusion.  You develop chest pain.  You develop shortness of breath.  MAKE SURE YOU:  Understand these instructions.  Will watch your condition.  Will get help right away if you are not doing well or get worse.   This information is  not intended to replace advice given to you by your health care provider. Make sure you discuss any questions you have with your health care provider.   Document Released: 03/30/2002 Document Revised: 03/10/2013 Document Reviewed: 01/01/2013 Elsevier Interactive Patient Education [employer].

## 2016-01-25 NOTE — ED Provider Notes (Signed)
CSN: [id number]     Arrival date & time 01/25/16  1908 History   First MD Initiated Contact with Patient 01/25/16 2122     No chief complaint on file.    (Consider location/radiation/quality/duration/timing/severity/associated sxs/prior Treatment) HPI   Sanda KleinJohn Karapetian is a 45 y.o. male who is here for evaluation of neck pain, that is persistent, despite usual treatments. He has also been in the emergency department 3 times for the same problem in the last 4 weeks. He states that his last epidural injection, in the neck, 2 weeks ago, did not help. He is unable to get back into his pain management doctor, by his report. He states that he is "embarrassed to be here". He denies fever, chills, nausea, vomiting, cough, shortness of breath, chest pain, weakness or dizziness. There are no other known modifying factors.   Past Medical History  Diagnosis Date  . Pinched nerve     cervical  . DDD (degenerative disc disease), cervical   . Epididymal cyst     RIGHT   Past Surgical History  Procedure Laterality Date  . Reconstruction mandible  age 45    correct bite bilateral upper  . Removal lipoma, back  03/ 2014  . Removal hardware right upper mandible  04/ 2015  . Spermatocelectomy Right 03/18/2014    Procedure: RIGHT SPERMATOCELECTOMY;  Surgeon: Crist FatBenjamin W Herrick, MD;  [facility];  Service: Urology;  Laterality: Right;   No family history on file. Social History  Substance Use Topics  . Smoking status: Never Smoker   . Smokeless tobacco: Current User    Types: Snuff    Last Attempt to Quit: 01/09/2014  . Alcohol Use: Yes     Comment: occasional    Review of Systems  All other systems reviewed and are negative.     Allergies  Review of patient's allergies indicates no known allergies.  Home Medications   Prior to Admission medications   Medication Sig Start Date End Date Taking? Authorizing Provider  eszopiclone (LUNESTA) 2 MG TABS tablet Take 2 mg  by mouth at bedtime as needed for sleep.  01/13/16  Yes Historical Provider, MD  gabapentin (NEURONTIN) 300 MG capsule Take 600-900 mg by mouth 4 (four) times daily. Take  600mg s in the morning and afternoon then take 900mg s at dinnertime and bedtime   Yes Historical Provider, MD  ibuprofen (ADVIL,MOTRIN) 200 MG tablet Take 400 mg by mouth every 6 (six) hours as needed for moderate pain.   Yes Historical Provider, MD  loperamide (IMODIUM A-D) 2 MG tablet Take 2-4 mg by mouth 4 (four) times daily as needed for diarrhea or loose stools.    Yes Historical Provider, MD  loratadine (CLARITIN) 10 MG tablet Take 10 mg by mouth daily as needed for allergies.   Yes Historical Provider, MD  oxyCODONE-acetaminophen (PERCOCET) 10-325 MG tablet Take 1 tablet by mouth every 6 (six) hours as needed. for pain 01/10/16  Yes Historical Provider, MD  predniSONE (STERAPRED UNI-PAK 21 TAB) 10 MG (21) TBPK tablet Take 10-60 mg by mouth daily. For 6 days 01/24/16  Yes Historical Provider, MD  venlafaxine XR (EFFEXOR-XR) 75 MG 24 hr capsule Take 75 mg by mouth daily. 01/08/16  Yes Historical Provider, MD  diazepam (VALIUM) 5 MG tablet Take 1 tablet (5 mg total) by mouth every 6 (six) hours as needed for anxiety. Patient not taking: Reported on 01/25/2016 01/04/16   Santiago GladHeather Laisure, PA-C  HYDROmorphone (DILAUDID) 2 MG tablet Take 1  tablet (2 mg total) by mouth every 6 (six) hours as needed for moderate pain. Patient not taking: Reported on 01/25/2016 01/05/16   Rolland PorterMark James, MD  naproxen (NAPROSYN) 500 MG tablet Take 1 tablet (500 mg total) by mouth 2 (two) times daily as needed for mild pain, moderate pain or headache (TAKE WITH MEALS.). Patient not taking: Reported on 01/25/2016 09/04/15   Mercedes Camprubi-Soms, PA-C  oxyCODONE-acetaminophen (PERCOCET) 5-325 MG tablet Take 1 tablet by mouth every 6 (six) hours as needed for severe pain. Patient not taking: Reported on 01/25/2016 09/04/15   Mercedes Camprubi-Soms, PA-C   oxyCODONE-acetaminophen (PERCOCET/ROXICET) 5-325 MG tablet Take 2 tablets by mouth every 6 (six) hours as needed for moderate pain or severe pain. Patient not taking: Reported on 01/25/2016 09/12/15   Tharon AquasFrank C Patrick, PA   BP 155/103 mmHg  Pulse 86  Temp(Src) 98.7 F (37.1 C) (Oral)  Resp 18  SpO2 99% Physical Exam  Constitutional: He is oriented to person, place, and time. He appears well-developed and well-nourished.  HENT:  Head: Normocephalic and atraumatic.  Right Ear: External ear normal.  Left Ear: External ear normal.  Eyes: Conjunctivae and EOM are normal. Pupils are equal, round, and reactive to light.  Neck: Normal range of motion and phonation normal. Neck supple.  Cardiovascular: Normal rate.   Pulmonary/Chest: Effort normal. He exhibits no bony tenderness.  Musculoskeletal: Normal range of motion.  Normal gait  Neurological: He is alert and oriented to person, place, and time. No cranial nerve deficit or sensory deficit. He exhibits normal muscle tone. Coordination normal.  Skin: Skin is warm, dry and intact.  Psychiatric: He has a normal mood and affect. His behavior is normal. Judgment and thought content normal.  Nursing note and vitals reviewed.   ED Course  Procedures (including critical care time) Medications  HYDROmorphone (DILAUDID) injection 2 mg (2 mg Intramuscular Given 01/25/16 2149)    Patient Vitals for the past 24 hrs:  BP Temp Temp src Pulse Resp SpO2  01/25/16 1945 (!) 155/103 mmHg 98.7 F (37.1 C) Oral 86 18 99 %    At discharge- Reevaluation with update and discussion. After initial assessment and treatment, an updated evaluation reveals no additional complaints. Findings and plan discussed with patient.Mancel Bale. Alixis Harmon L    Labs Review Labs Reviewed - No data to display  Imaging Review No results found. I have personally reviewed and evaluated these images and lab results as part of my medical decision-making.   EKG Interpretation None       MDM   Final diagnoses:  Chronic pain    Chronic pain, neck, with frequent utilization of emergency services. This is certainly questionable behavior. Review of prior prescriptions, in Epic, indicate multiple prescriptions by multiple providers, of narcotic medication. Also, concerning her multiple different products given. Patient was verbally informed by me, that this is questionable behavior that will  Result  in no treatment in the emergency department, at some point in the future.  Nursing Notes Reviewed/ Care Coordinated Applicable Imaging Reviewed Interpretation of Laboratory Data incorporated into ED treatment  The patient appears reasonably screened and/or stabilized for discharge and I doubt any other medical condition or other [facility] requiring further screening, evaluation, or treatment in the ED at this time prior to discharge.  Plan: Home Medications- usual; Home Treatments- rest; return here if the recommended treatment, does not improve the symptoms; Recommended follow up- pain managing Dr. as soon as possible. Consider following up with neurosurgery.    Mechele CollinElliott  Effie Shy, MD 01/25/16 2211

## 2016-01-25 NOTE — ED Notes (Addendum)
Patient presents for neck pain x3-4 weeks. Seen recently for same, given epidural steroid shot, minimal relief with same. Denies fever, able to touch chin to chest, no numbness/tingling in legs, no loss of bladder or bowel. C/o intermittent numbness and tingling in left arm x1 year. A&O x4, ambulatory with steady gait.

## 2016-02-07 ENCOUNTER — Encounter (HOSPITAL_COMMUNITY): Payer: Self-pay

## 2016-02-07 ENCOUNTER — Emergency Department (HOSPITAL_COMMUNITY): Payer: BLUE CROSS/BLUE SHIELD

## 2016-02-07 ENCOUNTER — Emergency Department (HOSPITAL_COMMUNITY)
Admission: EM | Admit: 2016-02-07 | Discharge: 2016-02-08 | Disposition: A | Discharge status: Home / Self Care | Payer: BLUE CROSS/BLUE SHIELD | Attending: Emergency Medicine | Admitting: Emergency Medicine

## 2016-02-07 DIAGNOSIS — M62838 Other muscle spasm: Secondary | ICD-10-CM

## 2016-02-07 DIAGNOSIS — R079 Chest pain, unspecified: Secondary | ICD-10-CM | POA: Diagnosis present

## 2016-02-07 LAB — BASIC METABOLIC PANEL
ANION GAP: 9 (ref 5–15)
BUN: 16 mg/dL (ref 6–20)
CALCIUM: 9.5 mg/dL (ref 8.9–10.3)
CO2: 26 mmol/L (ref 22–32)
Chloride: 102 mmol/L (ref 101–111)
Creatinine, Ser: 0.81 mg/dL (ref 0.61–1.24)
GFR calc Af Amer: >60 mL/min (ref >60)
GLUCOSE: 100 mg/dL — AB (ref 65–99)
POTASSIUM: 3.7 mmol/L (ref 3.5–5.1)
Sodium: 137 mmol/L (ref 135–145)

## 2016-02-07 LAB — CBC WITH DIFFERENTIAL/PLATELET
Basophils Absolute: 0 10*3/uL (ref 0.0–0.1)
Basophils Relative: 0 %
EOS PCT: 0 %
Eosinophils Absolute: 0 10*3/uL (ref 0.0–0.7)
HEMATOCRIT: 43 % (ref 39.0–52.0)
Hemoglobin: 15.7 g/dL (ref 13.0–17.0)
LYMPHS ABS: 2.2 10*3/uL (ref 0.7–4.0)
LYMPHS PCT: 21 %
MCH: 32.3 pg (ref 26.0–34.0)
MCHC: 36.5 g/dL — ABNORMAL HIGH (ref 30.0–36.0)
MCV: 88.5 fL (ref 78.0–100.0)
MONO ABS: 0.6 10*3/uL (ref 0.1–1.0)
Monocytes Relative: 6 %
NEUTROS ABS: 7.7 10*3/uL (ref 1.7–7.7)
Neutrophils Relative %: 73 %
PLATELETS: 244 10*3/uL (ref 150–400)
RBC: 4.86 MIL/uL (ref 4.22–5.81)
RDW: 13 % (ref 11.5–15.5)
WBC: 10.6 10*3/uL — ABNORMAL HIGH (ref 4.0–10.5)

## 2016-02-07 LAB — I-STAT TROPONIN, ED: Troponin i, poc: 0 ng/mL (ref 0.00–0.08)

## 2016-02-07 MED ORDER — DIAZEPAM 5 MG/ML IJ SOLN
5.0000 mg | Freq: Once | INTRAMUSCULAR | Status: AC
  Administered 2016-02-07: 5 mg via INTRAVENOUS
  Filled 2016-02-07: qty 2

## 2016-02-07 MED ORDER — SODIUM CHLORIDE 0.9 % IV BOLUS (SEPSIS)
1000.0000 mL | Freq: Once | INTRAVENOUS | Status: AC
  Administered 2016-02-07: 1000 mL via INTRAVENOUS

## 2016-02-07 MED ORDER — KETOROLAC TROMETHAMINE 30 MG/ML IJ SOLN
30.0000 mg | Freq: Once | INTRAMUSCULAR | Status: AC
  Administered 2016-02-07: 30 mg via INTRAVENOUS
  Filled 2016-02-07: qty 1

## 2016-02-07 NOTE — ED Provider Notes (Signed)
CSN: [id number]     Arrival date & time 02/07/16  2100 History  By signing my name below, I, Vista Mink, attest that this documentation has been prepared under the direction and in the presence of Cristal Howatt PA-C.   Electronically Signed: Vista Mink, ED Scribe. 02/07/2016. 11:46 PM.   Chief Complaint  Patient presents with  . Chest Pain   The history is provided by the patient. No language interpreter was used.   HPI Comments: Roger Gonzales is a 45 y.o. male with a PMHx of DDD, cervical pinched nerve, who presents to the Emergency Department complaining of chest pain with associated muscle cramping that started this evening. Pt states he went to dinner with his parents at 1800 when he started having "nervous tics" and "muscle spasms" in his diaphragm started shortly after. The muscle spasms cause "hiccup like" spasms and pain. Pt also complains of associated shortness of breath. Denies exertional chest pain, diaphoresis, neck pain, nausea, vomiting, dizziness or syncope. Pt states he has a Hx of these episodes; this is the 3rd episode. prior episode was 03/30/2015. Pt states he was given "some sort of concoction through an IV" during past episodes which resolved his symptoms.   Chart review: pt evaluated in ED 03/2015 with similar presentation of chest wall/diaphragmatic muscle spasms. Thought to be possibly related to his gabapentin use. Treated with narcotics, muscle relaxers, steroids and toradol. Multiple rounds required to resolve symptoms. Discharged with outpatient f/u.    Past Medical History  Diagnosis Date  . Pinched nerve     cervical  . DDD (degenerative disc disease), cervical   . Epididymal cyst     RIGHT   Past Surgical History  Procedure Laterality Date  . Reconstruction mandible  age 89    correct bite bilateral upper  . Removal lipoma, back  03/ 2014  . Removal hardware right upper mandible  04/ 2015  . Spermatocelectomy Right 03/18/2014    Procedure: RIGHT  SPERMATOCELECTOMY;  Surgeon: Crist Fat, MD;  Location: [facility];  Service: Urology;  Laterality: Right;   History reviewed. No pertinent family history. Social History  Substance Use Topics  . Smoking status: Never Smoker   . Smokeless tobacco: Current User    Types: Snuff    Last Attempt to Quit: 01/09/2014  . Alcohol Use: Yes     Comment: occasional    Review of Systems  Respiratory: Positive for shortness of breath.   Cardiovascular: Positive for chest pain.  All other systems reviewed and are negative.     Allergies  Review of patient's allergies indicates no known allergies.  Home Medications   Prior to Admission medications   Medication Sig Start Date End Date Taking? Authorizing Provider  diphenhydrAMINE (BENADRYL) 25 mg capsule Take 25 mg by mouth once.   Yes Historical Provider, MD  eszopiclone (LUNESTA) 2 MG TABS tablet Take 2 mg by mouth at bedtime as needed for sleep.  01/13/16  Yes Historical Provider, MD  gabapentin (NEURONTIN) 300 MG capsule Take 600-900 mg by mouth 4 (four) times daily. Take  s in the morning and afternoon then take s at dinnertime and bedtime   Yes Historical Provider, MD  HYDROcodone-acetaminophen (NORCO) 10-325 MG tablet Take 1 tablet by mouth every 6 (six) hours as needed. 01/31/16 03/02/16 Yes Historical Provider, MD  ibuprofen (ADVIL,MOTRIN) 200 MG tablet Take 400 mg by mouth every 6 (six) hours as needed for moderate pain.   Yes Historical Provider, MD  loratadine (  CLARITIN) 10 MG tablet Take 10 mg by mouth daily as needed for allergies.   Yes Historical Provider, MD  Pheniramine-PE-APAP [facility](THERAFLU COLD & SORE THROAT) 20-10-325 MG PACK Take 1 each by mouth once.   Yes Historical Provider, MD  venlafaxine XR (EFFEXOR-XR) 75 MG 24 hr capsule Take 75 mg by mouth daily. 01/08/16  Yes Historical Provider, MD  cyclobenzaprine (FLEXERIL) 10 MG tablet Take 1 tablet (10 mg total) by mouth 2 (two) times daily as needed  for muscle spasms. 02/08/16   Radiah Lubinski, PA-C  diazepam (VALIUM) 5 MG tablet Take 1 tablet (5 mg total) by mouth every 6 (six) hours as needed for anxiety. Patient not taking: Reported on 01/25/2016 01/04/16   Santiago GladHeather Laisure, PA-C  HYDROmorphone (DILAUDID) 2 MG tablet Take 1 tablet (2 mg total) by mouth every 6 (six) hours as needed for moderate pain. Patient not taking: Reported on 01/25/2016 01/05/16   Rolland PorterMark James, MD  naproxen (NAPROSYN) 500 MG tablet Take 1 tablet (500 mg total) by mouth 2 (two) times daily as needed for mild pain, moderate pain or headache (TAKE WITH MEALS.). Patient not taking: Reported on 01/25/2016 09/04/15   Mercedes Camprubi-Soms, PA-C  oxyCODONE-acetaminophen (PERCOCET) 5-325 MG tablet Take 1 tablet by mouth every 6 (six) hours as needed for severe pain. Patient not taking: Reported on 01/25/2016 09/04/15   Mercedes Camprubi-Soms, PA-C  oxyCODONE-acetaminophen (PERCOCET/ROXICET) 5-325 MG tablet Take 2 tablets by mouth every 6 (six) hours as needed for moderate pain or severe pain. Patient not taking: Reported on 01/25/2016 09/12/15   Tharon AquasFrank C Patrick, PA   BP 145/94 mmHg  Pulse 95  Temp(Src) 98.6 F (37 C) (Oral)  Resp 20  Ht 6\' 1"  (1.854 m)  Wt 83.915 kg  BMI 24.41 kg/m2  SpO2 96% Physical Exam  Constitutional: He appears well-developed and well-nourished. No distress.  HENT:  Head: Normocephalic and atraumatic.  Eyes: Conjunctivae are normal. Right eye exhibits no discharge. Left eye exhibits no discharge. No scleral icterus.  Neck: Normal range of motion.  Cardiovascular: Normal rate, regular rhythm and normal heart sounds.   Pulmonary/Chest: Effort normal and breath sounds normal. No respiratory distress.  Pt with apparent muscle spasms of chest wall causing hiccup-like symptoms. Pt able to talk with clear speech.   Abdominal: Soft. Bowel sounds are normal. He exhibits no distension. There is no tenderness.  Musculoskeletal: Normal range of motion.  Neurological: He  is alert. Coordination normal.  Skin: Skin is warm and dry.  Psychiatric: He has a normal mood and affect. His behavior is normal.  Nursing note and vitals reviewed.   ED Course  Procedures  DIAGNOSTIC STUDIES: Oxygen Saturation is 96% on RA, normal by my interpretation.  COORDINATION OF CARE: 11:45 PM-Will order muscle relaxer and imaging. Discussed treatment plan with pt at bedside and pt agreed to plan.   Labs Review Labs Reviewed  CBC WITH DIFFERENTIAL/PLATELET - Abnormal; Notable for the following:    WBC 10.6 (*)    MCHC 36.5 (*)    All other components within normal limits  BASIC METABOLIC PANEL - Abnormal; Notable for the following:    Glucose, Bld 100 (*)    All other components within normal limits  I-STAT TROPOININ, ED    Imaging Review Dg Chest 2 View  02/07/2016  CLINICAL DATA:  Chest pain EXAM: CHEST  2 VIEW COMPARISON:  09/02/2014 FINDINGS: Normal heart size and mediastinal contours. No acute infiltrate or edema. No effusion or pneumothorax. No acute osseous findings.  IMPRESSION: Negative chest. Electronically Signed   By: Marnee Spring M.D.   On: 02/07/2016 23:11   I have personally reviewed and evaluated these images and lab results as part of my medical decision-making.   EKG Interpretation   Date/Time:  Wednesday February 07 2016 21:07:46 EDT Ventricular Rate:  94 PR Interval:    QRS Duration: 94 QT Interval:  341 QTC Calculation: 427 R Axis:   21 Text Interpretation:  Sinus rhythm Nonspecific ST abnormality No  significant change since last tracing Confirmed by Denton Lank  MD, Caryn Bee  ([id number]) on 02/07/2016 10:03:29 PM      MDM   Final diagnoses:  Muscle spasm   45 year old male presenting with muscle spasms of the chest wall. Pt reports two other similar episodes that resolved with multiple medications in ER. Afebrile and hemodynamically stable. Pt with constant hiccup-like presentation. Heart RRR. Lungs CTAB. Very mild leukocytosis. Blood work  unremarkable. Troponin negative with non-ischemic ECG. Chest xray normal. Chart reviewed and pt had similar presentation which resolved with multiple medications. Given toradol and valium which initially improved symptoms but pt called out later stating they had returned. Repeated valium with no improvement. Of note, upon entering exam room for re-evaluation at this time pt was sleeping comfortably without apparent muscle spasms. Upon waking pt, muscle spasm activity present again. Given decadron, morphine and ativan. Pt now reports improvement in symptoms. Will d/c with flexeril and PCP follow up. Pt has low heart score and this is unlikely to be cardiac in nature. Discussed pt with Dr. Clarene Duke who agrees with this plan. Return precautions given in discharge paperwork and discussed with pt at bedside. Pt stable for discharge  I personally performed the services described in this documentation, which was scribed in my presence. The recorded information has been reviewed and is accurate.    Alveta Heimlich, PA-C 02/08/16 1300   Laurence Spates, MD 02/12/16 [phone]

## 2016-02-07 NOTE — ED Notes (Signed)
Pt complains of hiccup looking activity that is causing chest pain and pressure, hes' had this before, this is the third episode

## 2016-02-08 MED ORDER — CYCLOBENZAPRINE HCL 10 MG PO TABS: 10.0000 mg | ORAL_TABLET | Freq: Two times a day (BID) | ORAL | Status: DC | PRN

## 2016-02-08 MED ORDER — DEXAMETHASONE SODIUM PHOSPHATE 10 MG/ML IJ SOLN
10.0000 mg | Freq: Once | INTRAMUSCULAR | Status: AC
  Administered 2016-02-08: 10 mg via INTRAVENOUS
  Filled 2016-02-08: qty 1

## 2016-02-08 MED ORDER — MORPHINE SULFATE (PF) 4 MG/ML IV SOLN
4.0000 mg | Freq: Once | INTRAVENOUS | Status: AC
  Administered 2016-02-08: 4 mg via INTRAVENOUS
  Filled 2016-02-08: qty 1

## 2016-02-08 MED ORDER — LORAZEPAM 2 MG/ML IJ SOLN
1.0000 mg | Freq: Once | INTRAMUSCULAR | Status: AC
  Administered 2016-02-08: 1 mg via INTRAVENOUS
  Filled 2016-02-08: qty 1

## 2016-02-08 MED ORDER — DIAZEPAM 5 MG/ML IJ SOLN
5.0000 mg | Freq: Once | INTRAMUSCULAR | Status: AC
  Administered 2016-02-08: 5 mg via INTRAVENOUS
  Filled 2016-02-08: qty 2

## 2016-02-08 NOTE — Discharge Instructions (Signed)

## 2016-02-08 NOTE — ED Notes (Signed)
Pt family member called out, stating spasms have returned.

## 2016-02-08 NOTE — ED Notes (Signed)
Report given to Jon, RN

## 2016-02-08 NOTE — ED Notes (Signed)
Rolm GalaStevi, PA made aware pt states that there has been "no improvement" with medications given.

## 2016-02-08 NOTE — ED Notes (Signed)
Pt reports spasms have decreased, but that they continue "in my neck." PA made aware.

## 2016-04-05 ENCOUNTER — Encounter (HOSPITAL_COMMUNITY): Payer: Self-pay

## 2016-04-05 ENCOUNTER — Emergency Department (HOSPITAL_COMMUNITY)
Admission: EM | Admit: 2016-04-05 | Discharge: 2016-04-05 | Disposition: A | Discharge status: Home / Self Care | Payer: BLUE CROSS/BLUE SHIELD | Attending: Emergency Medicine | Admitting: Emergency Medicine

## 2016-04-05 DIAGNOSIS — M62838 Other muscle spasm: Secondary | ICD-10-CM | POA: Diagnosis not present

## 2016-04-05 LAB — CBC WITH DIFFERENTIAL/PLATELET
BASOS PCT: 0 %
Basophils Absolute: 0 10*3/uL (ref 0.0–0.1)
Eosinophils Absolute: 0 10*3/uL (ref 0.0–0.7)
Eosinophils Relative: 0 %
HEMATOCRIT: 42.7 % (ref 39.0–52.0)
HEMOGLOBIN: 15 g/dL (ref 13.0–17.0)
LYMPHS ABS: 1.7 10*3/uL (ref 0.7–4.0)
Lymphocytes Relative: 26 %
MCH: 31.6 pg (ref 26.0–34.0)
MCHC: 35.1 g/dL (ref 30.0–36.0)
MCV: 89.9 fL (ref 78.0–100.0)
MONO ABS: 0.4 10*3/uL (ref 0.1–1.0)
MONOS PCT: 7 %
NEUTROS ABS: 4.4 10*3/uL (ref 1.7–7.7)
NEUTROS PCT: 67 %
Platelets: 182 10*3/uL (ref 150–400)
RBC: 4.75 MIL/uL (ref 4.22–5.81)
RDW: 12.8 % (ref 11.5–15.5)
WBC: 6.6 10*3/uL (ref 4.0–10.5)

## 2016-04-05 LAB — BASIC METABOLIC PANEL
ANION GAP: 8 (ref 5–15)
BUN: 16 mg/dL (ref 6–20)
CALCIUM: 9.3 mg/dL (ref 8.9–10.3)
CHLORIDE: 106 mmol/L (ref 101–111)
CO2: 25 mmol/L (ref 22–32)
CREATININE: 0.82 mg/dL (ref 0.61–1.24)
GFR calc non Af Amer: >60 mL/min (ref >60)
GLUCOSE: 105 mg/dL — AB (ref 65–99)
Potassium: 4.1 mmol/L (ref 3.5–5.1)
Sodium: 139 mmol/L (ref 135–145)

## 2016-04-05 MED ORDER — DIAZEPAM 5 MG PO TABS
5.0000 mg | ORAL_TABLET | Freq: Two times a day (BID) | ORAL | 0 refills | Status: AC
Start: 2016-04-05 — End: 2016-04-12

## 2016-04-05 MED ORDER — SODIUM CHLORIDE 0.9 % IV BOLUS (SEPSIS)
1000.0000 mL | Freq: Once | INTRAVENOUS | Status: AC
  Administered 2016-04-05: 1000 mL via INTRAVENOUS

## 2016-04-05 MED ORDER — DIAZEPAM 5 MG/ML IJ SOLN
5.0000 mg | Freq: Once | INTRAMUSCULAR | Status: AC
  Administered 2016-04-05: 5 mg via INTRAVENOUS
  Filled 2016-04-05: qty 2

## 2016-04-05 MED ORDER — DIAZEPAM 5 MG/ML IJ SOLN
2.5000 mg | Freq: Once | INTRAMUSCULAR | Status: AC
  Administered 2016-04-05: 2.5 mg via INTRAVENOUS
  Filled 2016-04-05: qty 2

## 2016-04-05 MED ORDER — KETOROLAC TROMETHAMINE 30 MG/ML IJ SOLN
30.0000 mg | Freq: Once | INTRAMUSCULAR | Status: AC
  Administered 2016-04-05: 30 mg via INTRAVENOUS
  Filled 2016-04-05: qty 1

## 2016-04-05 MED ORDER — DEXAMETHASONE SODIUM PHOSPHATE 10 MG/ML IJ SOLN
10.0000 mg | Freq: Once | INTRAMUSCULAR | Status: AC
  Administered 2016-04-05: 10 mg via INTRAVENOUS
  Filled 2016-04-05: qty 1

## 2016-04-05 MED ORDER — MORPHINE SULFATE (PF) 4 MG/ML IV SOLN
4.0000 mg | Freq: Once | INTRAVENOUS | Status: AC
  Administered 2016-04-05: 4 mg via INTRAVENOUS
  Filled 2016-04-05: qty 1

## 2016-04-05 NOTE — ED Notes (Signed)
Pt verbalized understanding of d/c instructions and has no further questions. Pt stable, ambulatory and NAD. Pt educated on valium use.

## 2016-04-05 NOTE — ED Provider Notes (Signed)
Emergency Department Provider Note   I have reviewed the triage vital signs and the nursing notes.   HISTORY  Chief Complaint Spasms   HPI Roger Gonzales is a 45 y.o. male with PMH of DDD and cervical spine bulging presents to the ED with return of neck and diaphragmatic spasms that started last night. The patient had multiple ED presentations for similar and is followed by Neurosurgery, Dr. Yetta Barre, with plan for c-spine surgery in 1 week. The patient states that his left neck began having spasm last night. He was able to sleep but spasms returned this AM upon waking and have since progressed to involve the diaphragm. No worsening midline neck pain or worsening upper extremity weakness/numbness.    Past Medical History:  Diagnosis Date  . DDD (degenerative disc disease), cervical   . Epididymal cyst    RIGHT  . Pinched nerve    cervical    Patient Active Problem List   Diagnosis Date Noted  . PAIN IN JOINT, UPPER ARM 01/20/2011    Past Surgical History:  Procedure Laterality Date  . RECONSTRUCTION MANDIBLE  age 23   correct bite bilateral upper  . REMOVAL HARDWARE RIGHT UPPER MANDIBLE  04/ 2015  . REMOVAL LIPOMA, BACK  03/ 2014  . SPERMATOCELECTOMY Right 03/18/2014   Procedure: RIGHT SPERMATOCELECTOMY;  Surgeon: Crist Fat, MD;  Location: [facility];  Service: Urology;  Laterality: Right;    Current Outpatient Rx  . Order #: [id number] Class: Print  . Order #: [id number] Class: Historical Med  . Order #: [id number] Class: Historical Med  . Order #: [id number] Class: Historical Med  . Order #: [id number] Class: Print  . Order #: [id number] Class: Print  . Order #: [id number] Class: Print  . Order #: [id number] Class: Print  . Order #: [id number] Class: Print    Allergies Review of patient's allergies indicates no known allergies.  No family history on file.  Social History Social History  Substance Use Topics  . Smoking status: Never Smoker  .  Smokeless tobacco: Former Neurosurgeon    Types: Snuff    Quit date: 01/09/2014  . Alcohol use Yes     Comment: occasional    Review of Systems  Constitutional: No fever/chills Eyes: No visual changes. ENT: No sore throat.  Cardiovascular: Denies chest pain. Respiratory: Denies shortness of breath. Gastrointestinal: No abdominal pain.  No nausea, no vomiting.  No diarrhea.  No constipation. Genitourinary: Negative for dysuria. Musculoskeletal: Negative for back pain. Positive neck and back spasms.  Skin: Negative for rash. Neurological: Negative for headaches, focal weakness or numbness.  10-point ROS otherwise negative.  ____________________________________________   PHYSICAL EXAM:  VITAL SIGNS: ED Triage Vitals  Enc Vitals Group     BP 04/05/16 1247 (!) 139/116     Pulse Rate 04/05/16 1247 98     Resp 04/05/16 1247 18     Temp 04/05/16 1247 98.4 F (36.9 C)     Temp Source 04/05/16 1247 Oral     SpO2 04/05/16 1247 97 %     Weight 04/05/16 1248 190 lb (86.2 kg)     Height 04/05/16 1248 6\' 1"  (1.854 m)     Pain Score 04/05/16 1248 6    Constitutional: Frequent hiccup-like spasms worse when talking.  Eyes: Conjunctivae are normal. Head: Atraumatic. Nose: No congestion/rhinnorhea. Mouth/Throat: Mucous membranes are moist.  Oropharynx non-erythematous. Neck: No stridor. Mild cervical spine tenderness to palpation.  Cardiovascular: Normal rate, regular rhythm. Good peripheral circulation. Grossly normal heart sounds.   Respiratory:  Normal respiratory effort.  No retractions. Lungs CTAB. Gastrointestinal: Soft and nontender. No distention.  Musculoskeletal: No lower extremity tenderness nor edema. No gross deformities of extremities. Neurologic:  Normal speech and language. Numbness to left index finger to light touch. No appreciable weakness in upper or lower extremities.  Skin:  Skin is warm, dry and intact. No rash noted.   ____________________________________________     LABS (all labs ordered are listed, but only abnormal results are displayed)  Labs Reviewed  BASIC METABOLIC PANEL - Abnormal; Notable for the following:       Result Value   Glucose, Bld 105 (*)    All other components within normal limits  CBC WITH DIFFERENTIAL/PLATELET   ____________________________________________  RADIOLOGY  None ____________________________________________   PROCEDURES  Procedure(s) performed:   Procedures  None ____________________________________________   INITIAL IMPRESSION / ASSESSMENT AND PLAN / ED COURSE  Pertinent labs & imaging results that were available during my care of the patient were reviewed by me and considered in my medical decision making (see chart for details).  Patient presents to the ED with recurrent neck and diaphragmatic spasms of unclear etiology. Neurosurgery is following for c-spine herniated disc with surgery planned in 1 week. No clinical sings of worsening herniation or spinal cord compression on exam. Plan for basic labs, Valium, Toradol, and discussion with Neurosurgery team.   02:38 PM  Discussed case with Dr. Yetta BarreJones by phone. No clear indication on imaging that spasms are directly related to spine issue. No plan for earlier surgical intervention at this time. Patient with slightly improved symptoms after Valium and Toradol. Awaiting baseline labs and will reassess.   03:01 PM Patient with mild improvement in symptoms after Valium and Toradol. Continues to have intermittent spasms. Will give small additional dose of valium along with morphine and decadron. Updated patient and family regarding labs. Set expectation that we can try additional medication but will likely need to follow with NSG, Neurology, and PCP for further outpatient w/u of this recurrent issue. Patient and family are in agreement with plan.   03:43 PM Patient with improved symptoms. Feels ready for discharge and will manage symptoms at home. Answered  all questions and provided Neurology follow up contact information at this time.   At this time, I do not feel there is any life-threatening condition present. I have reviewed and discussed all results (EKG, imaging, lab, urine as appropriate), exam findings with patient. I have reviewed nursing notes and appropriate previous records.  I feel the patient is safe to be discharged home without further emergent workup. Discussed usual and customary return precautions. Patient and family (if present) verbalize understanding and are comfortable with this plan.  Patient will follow-up with their primary care provider. If they do not have a primary care provider, information for follow-up has been provided to them. All questions have been answered.  ____________________________________________  FINAL CLINICAL IMPRESSION(S) / ED DIAGNOSES  Final diagnoses:  Muscle spasm     MEDICATIONS GIVEN DURING THIS VISIT:  Medications  diazepam (VALIUM) injection 5 mg (5 mg Intravenous Given 04/05/16 1426)  sodium chloride 0.9 % bolus 1,000 mL (0 mLs Intravenous Stopped 04/05/16 1613)  ketorolac (TORADOL) 30 MG/ML injection 30 mg (30 mg Intravenous Given 04/05/16 1426)  diazepam (VALIUM) injection 2.5 mg (2.5 mg Intravenous Given 04/05/16 1518)  morphine 4 MG/ML injection 4 mg (4 mg Intravenous Given 04/05/16 1520)  dexamethasone (DECADRON) injection 10 mg (10 mg Intravenous Given 04/05/16 1517)     NEW OUTPATIENT  MEDICATIONS STARTED DURING THIS VISIT:  Valium 5 mg PO BID (1 week supply)    Note:  This document was prepared using Dragon voice recognition software and may include unintentional dictation errors.  Alona Bene, MD Emergency Medicine   Maia Plan, MD 04/05/16 [phone]

## 2016-04-05 NOTE — Discharge Instructions (Signed)
You were seen in the ED today with muscle cramping and spasms. We treated your symptoms but it remains unclear what exactly is causing your problem. Keep your appointment with Neurosurgery as scheduled.   Follow up with your PCP to discuss possible reasons for your symptoms other and cervical disc bulging. I have also attached the contact information for a Neurology group in town to contact for additional workup of spasms.   Return to the ED with any new neck pain, upper extremity weakness, fever, chills, chest pain, or difficulty breathing.

## 2016-04-05 NOTE — ED Triage Notes (Signed)
Per Pt, Pt is coming from work with complaints of diaphragm and neck spasms. Pt has Hx of herniated disk between C3-C4. Pt is scheduled for a fusion next Friday. Pt started to have spasms yesterday morning. Pt went to office for appointment and neurosurgeon told him to come here. MD Marikay Alaravid Jones stated he would come over and see him.

## 2016-04-12 HISTORY — PX: CERVICAL FUSION: SHX112

## 2016-05-29 ENCOUNTER — Inpatient Hospital Stay (HOSPITAL_COMMUNITY)
Admission: EM | Admit: 2016-05-29 | Discharge: 2016-05-30 | DRG: 917 | Disposition: A | Discharge status: Home / Self Care | Payer: BLUE CROSS/BLUE SHIELD | Attending: Nephrology | Admitting: Nephrology

## 2016-05-29 ENCOUNTER — Emergency Department (HOSPITAL_COMMUNITY): Payer: BLUE CROSS/BLUE SHIELD

## 2016-05-29 ENCOUNTER — Encounter (HOSPITAL_COMMUNITY): Payer: Self-pay | Admitting: Emergency Medicine

## 2016-05-29 DIAGNOSIS — T40601A Poisoning by unspecified narcotics, accidental (unintentional), initial encounter: Secondary | ICD-10-CM | POA: Diagnosis present

## 2016-05-29 DIAGNOSIS — J690 Pneumonitis due to inhalation of food and vomit: Secondary | ICD-10-CM | POA: Diagnosis present

## 2016-05-29 DIAGNOSIS — J9602 Acute respiratory failure with hypercapnia: Secondary | ICD-10-CM | POA: Diagnosis present

## 2016-05-29 DIAGNOSIS — T450X1A Poisoning by antiallergic and antiemetic drugs, accidental (unintentional), initial encounter: Secondary | ICD-10-CM | POA: Diagnosis present

## 2016-05-29 DIAGNOSIS — R739 Hyperglycemia, unspecified: Secondary | ICD-10-CM | POA: Diagnosis not present

## 2016-05-29 DIAGNOSIS — F419 Anxiety disorder, unspecified: Secondary | ICD-10-CM | POA: Diagnosis present

## 2016-05-29 DIAGNOSIS — G894 Chronic pain syndrome: Secondary | ICD-10-CM | POA: Diagnosis present

## 2016-05-29 DIAGNOSIS — E876 Hypokalemia: Secondary | ICD-10-CM | POA: Diagnosis present

## 2016-05-29 DIAGNOSIS — G4733 Obstructive sleep apnea (adult) (pediatric): Secondary | ICD-10-CM | POA: Diagnosis present

## 2016-05-29 DIAGNOSIS — Y92013 Bedroom of single-family (private) house as the place of occurrence of the external cause: Secondary | ICD-10-CM | POA: Diagnosis not present

## 2016-05-29 DIAGNOSIS — Z791 Long term (current) use of non-steroidal anti-inflammatories (NSAID): Secondary | ICD-10-CM

## 2016-05-29 DIAGNOSIS — Z981 Arthrodesis status: Secondary | ICD-10-CM

## 2016-05-29 DIAGNOSIS — E872 Acidosis: Secondary | ICD-10-CM

## 2016-05-29 DIAGNOSIS — J189 Pneumonia, unspecified organism: Secondary | ICD-10-CM

## 2016-05-29 DIAGNOSIS — Z720 Tobacco use: Secondary | ICD-10-CM | POA: Diagnosis not present

## 2016-05-29 DIAGNOSIS — M5030 Other cervical disc degeneration, unspecified cervical region: Secondary | ICD-10-CM | POA: Diagnosis present

## 2016-05-29 DIAGNOSIS — F329 Major depressive disorder, single episode, unspecified: Secondary | ICD-10-CM | POA: Diagnosis present

## 2016-05-29 DIAGNOSIS — J9601 Acute respiratory failure with hypoxia: Secondary | ICD-10-CM | POA: Diagnosis present

## 2016-05-29 DIAGNOSIS — T5191XA Toxic effect of unspecified alcohol, accidental (unintentional), initial encounter: Secondary | ICD-10-CM | POA: Diagnosis present

## 2016-05-29 DIAGNOSIS — Z79899 Other long term (current) drug therapy: Secondary | ICD-10-CM | POA: Diagnosis not present

## 2016-05-29 DIAGNOSIS — E8729 Other acidosis: Secondary | ICD-10-CM | POA: Diagnosis present

## 2016-05-29 DIAGNOSIS — W06XXXA Fall from bed, initial encounter: Secondary | ICD-10-CM | POA: Diagnosis present

## 2016-05-29 DIAGNOSIS — Z79891 Long term (current) use of opiate analgesic: Secondary | ICD-10-CM

## 2016-05-29 DIAGNOSIS — T50901A Poisoning by unspecified drugs, medicaments and biological substances, accidental (unintentional), initial encounter: Secondary | ICD-10-CM | POA: Insufficient documentation

## 2016-05-29 DIAGNOSIS — R066 Hiccough: Secondary | ICD-10-CM | POA: Diagnosis not present

## 2016-05-29 DIAGNOSIS — M62838 Other muscle spasm: Secondary | ICD-10-CM | POA: Diagnosis present

## 2016-05-29 DIAGNOSIS — J986 Disorders of diaphragm: Secondary | ICD-10-CM | POA: Diagnosis not present

## 2016-05-29 LAB — CBC WITH DIFFERENTIAL/PLATELET
BASOS PCT: 0 %
Basophils Absolute: 0 10*3/uL (ref 0.0–0.1)
EOS ABS: 0 10*3/uL (ref 0.0–0.7)
EOS PCT: 0 %
HCT: 42.7 % (ref 39.0–52.0)
Hemoglobin: 15 g/dL (ref 13.0–17.0)
Lymphocytes Relative: 5 %
Lymphs Abs: 0.9 10*3/uL (ref 0.7–4.0)
MCH: 31.3 pg (ref 26.0–34.0)
MCHC: 35.1 g/dL (ref 30.0–36.0)
MCV: 89.1 fL (ref 78.0–100.0)
MONO ABS: 1.6 10*3/uL — AB (ref 0.1–1.0)
MONOS PCT: 9 %
NEUTROS PCT: 86 %
Neutro Abs: 15.4 10*3/uL — ABNORMAL HIGH (ref 1.7–7.7)
PLATELETS: 204 10*3/uL (ref 150–400)
RBC: 4.79 MIL/uL (ref 4.22–5.81)
RDW: 13 % (ref 11.5–15.5)
WBC: 17.9 10*3/uL — ABNORMAL HIGH (ref 4.0–10.5)

## 2016-05-29 LAB — BLOOD GAS, ARTERIAL
ACID-BASE EXCESS: 1.3 mmol/L (ref 0.0–2.0)
Acid-Base Excess: 2 mmol/L (ref 0.0–2.0)
BICARBONATE: 30.8 mmol/L — AB (ref 20.0–28.0)
Bicarbonate: 28.5 mmol/L — ABNORMAL HIGH (ref 20.0–28.0)
DRAWN BY: 441261
Drawn by: 33147
FIO2: 1
FIO2: 30
Mode: POSITIVE
O2 SAT: 96.7 %
O2 Saturation: 97.5 %
PCO2 ART: 74.7 mmHg — AB (ref 32.0–48.0)
PCO2 ART: 55.6 mmHg — AB (ref 32.0–48.0)
PEEP: 5 cmH2O
PO2 ART: 102 mmHg (ref 83.0–108.0)
PO2 ART: 100 mmHg (ref 83.0–108.0)
Patient temperature: 98.6
Patient temperature: 98.6
Pressure control: 8 cmH2O
RATE: 15 resp/min
pH, Arterial: 7.239 — ABNORMAL LOW (ref 7.350–7.450)
pH, Arterial: 7.33 — ABNORMAL LOW (ref 7.350–7.450)

## 2016-05-29 LAB — COMPREHENSIVE METABOLIC PANEL
ALK PHOS: 84 U/L (ref 38–126)
ALT: 43 U/L (ref 17–63)
AST: 38 U/L (ref 15–41)
Albumin: 4.6 g/dL (ref 3.5–5.0)
Anion gap: 9 (ref 5–15)
BUN: 14 mg/dL (ref 6–20)
CALCIUM: 8.8 mg/dL — AB (ref 8.9–10.3)
CO2: 29 mmol/L (ref 22–32)
CREATININE: 0.9 mg/dL (ref 0.61–1.24)
Chloride: 102 mmol/L (ref 101–111)
GFR calc non Af Amer: >60 mL/min (ref >60)
GLUCOSE: 122 mg/dL — AB (ref 65–99)
Potassium: 3.3 mmol/L — ABNORMAL LOW (ref 3.5–5.1)
SODIUM: 140 mmol/L (ref 135–145)
Total Bilirubin: 0.4 mg/dL (ref 0.3–1.2)
Total Protein: 7.7 g/dL (ref 6.5–8.1)

## 2016-05-29 LAB — SALICYLATE LEVEL: Salicylate Lvl: <7 mg/dL (ref 2.8–30.0)

## 2016-05-29 LAB — CBG MONITORING, ED: Glucose-Capillary: 230 mg/dL — ABNORMAL HIGH (ref 65–99)

## 2016-05-29 LAB — ETHANOL

## 2016-05-29 LAB — I-STAT CG4 LACTIC ACID, ED: Lactic Acid, Venous: 1.51 mmol/L (ref 0.5–1.9)

## 2016-05-29 LAB — ACETAMINOPHEN LEVEL

## 2016-05-29 LAB — PROCALCITONIN: PROCALCITONIN: 0.48 ng/mL

## 2016-05-29 LAB — MRSA PCR SCREENING: MRSA BY PCR: NEGATIVE

## 2016-05-29 MED ORDER — PIPERACILLIN-TAZOBACTAM 3.375 G IVPB
3.3750 g | Freq: Three times a day (TID) | INTRAVENOUS | Status: DC
  Administered 2016-05-29 – 2016-05-30 (×4): 3.375 g via INTRAVENOUS
  Filled 2016-05-29 (×5): qty 50

## 2016-05-29 MED ORDER — NALOXONE HCL 0.4 MG/ML IJ SOLN: 0.4000 mg | INTRAMUSCULAR | Status: DC | PRN

## 2016-05-29 MED ORDER — BUTALBITAL-APAP-CAFFEINE 50-325-40 MG PO TABS
2.0000 | ORAL_TABLET | Freq: Once | ORAL | Status: AC
  Administered 2016-05-29: 2 via ORAL
  Filled 2016-05-29: qty 2

## 2016-05-29 MED ORDER — ONDANSETRON HCL 4 MG/2ML IJ SOLN
4.0000 mg | Freq: Four times a day (QID) | INTRAMUSCULAR | Status: DC | PRN
  Administered 2016-05-29: 4 mg via INTRAVENOUS
  Filled 2016-05-29: qty 2

## 2016-05-29 MED ORDER — SODIUM CHLORIDE 0.9 % IV BOLUS (SEPSIS)
1000.0000 mL | Freq: Once | INTRAVENOUS | Status: AC
  Administered 2016-05-29: 1000 mL via INTRAVENOUS

## 2016-05-29 MED ORDER — NALOXONE HCL 0.4 MG/ML IJ SOLN
0.4000 mg | Freq: Once | INTRAMUSCULAR | Status: AC
  Administered 2016-05-29: 0.4 mg via INTRAVENOUS
  Filled 2016-05-29: qty 1

## 2016-05-29 MED ORDER — CLOTRIMAZOLE 1 % EX CREA
TOPICAL_CREAM | Freq: Two times a day (BID) | CUTANEOUS | Status: DC
  Administered 2016-05-29 – 2016-05-30 (×3): via TOPICAL
  Filled 2016-05-29: qty 15

## 2016-05-29 MED ORDER — ENOXAPARIN SODIUM 40 MG/0.4ML ~~LOC~~ SOLN
40.0000 mg | SUBCUTANEOUS | Status: DC
  Administered 2016-05-29: 40 mg via SUBCUTANEOUS
  Filled 2016-05-29: qty 0.4

## 2016-05-29 MED ORDER — VENLAFAXINE HCL ER 75 MG PO CP24
75.0000 mg | ORAL_CAPSULE | Freq: Every day | ORAL | Status: DC
  Administered 2016-05-29 – 2016-05-30 (×2): 75 mg via ORAL
  Filled 2016-05-29 (×2): qty 1

## 2016-05-29 MED ORDER — DIAZEPAM 2 MG PO TABS
2.0000 mg | ORAL_TABLET | Freq: Once | ORAL | Status: AC
  Administered 2016-05-30: 2 mg via ORAL
  Filled 2016-05-29: qty 1

## 2016-05-29 MED ORDER — SODIUM CHLORIDE 0.9% FLUSH
3.0000 mL | Freq: Two times a day (BID) | INTRAVENOUS | Status: DC
  Administered 2016-05-29 – 2016-05-30 (×2): 3 mL via INTRAVENOUS

## 2016-05-29 MED ORDER — NAPROXEN 500 MG PO TABS
500.0000 mg | ORAL_TABLET | Freq: Two times a day (BID) | ORAL | Status: DC | PRN
  Administered 2016-05-29 – 2016-05-30 (×2): 500 mg via ORAL
  Filled 2016-05-29 (×4): qty 1

## 2016-05-29 MED ORDER — CYCLOBENZAPRINE HCL 10 MG PO TABS
10.0000 mg | ORAL_TABLET | Freq: Two times a day (BID) | ORAL | Status: DC | PRN
  Administered 2016-05-29: 10 mg via ORAL
  Filled 2016-05-29: qty 1

## 2016-05-29 NOTE — ED Provider Notes (Signed)
[facility]-EMERGENCY DEPT Provider Note   CSN: [id number] Arrival date & time: 05/29/16  [id number]     History   Chief Complaint Chief Complaint  Patient presents with  . Drug Overdose    HPI Roger Gonzales is a 45 y.o. male.  45 year old Caucasian male past medical history significant for degenerative disc disease status post cervical fusion September 2017 that presents to the ED today for drug overdose prior to arrival. Wife is at bedside and states that she woke up around 4 AM this morning and found her husband on the ground beside the bed with agonal breathing. She called 911 and started "chest compression and rescue breaths." When EMS arrived patient was continued to have agonal breathing. They gave him Narcan which patient responded to. His sats were also noted to be 87% on room air. He was put on 2 L and improved to 94%. The patient also had smokeless tobacco in his mouth and concerned that he aspirated on it. Patient states that he had a cervical fusion surgery in September 2017 and has been prescribe Norco and Percocet since. States last night he took 1 Percocet and 2 Norco along with 2 25 mg Benadryl and had two liquor drinks and went to bed. The next thing he remembers is waking up in the ambulance.   Wife at bedside states that she found an empty Percocet and Norco pill bottle. He had his medications filled on 11/3 and 10/3 for 30 and 40 pills respectively. Patient states that he mixes those pills with his other medicine and that is why the bottle is empty. Patient denies SI. States he did not mean take more pills then should have. Denies intention overdose. He denies any pain at this time. Denies any fever, ha, vision changes, lightheadedness, dizziness, cp, sob, abd pain, nausea, emesis, urinary symptom, change in bowel habits.      Past Medical History:  Diagnosis Date  . DDD (degenerative disc disease), cervical   . Epididymal cyst    RIGHT  . Pinched nerve    cervical     Patient Active Problem List   Diagnosis Date Noted  . PAIN IN JOINT, UPPER ARM 01/20/2011    Past Surgical History:  Procedure Laterality Date  . CERVICAL FUSION  04/12/2016  . RECONSTRUCTION MANDIBLE  age 89   correct bite bilateral upper  . REMOVAL HARDWARE RIGHT UPPER MANDIBLE  04/ 2015  . REMOVAL LIPOMA, BACK  03/ 2014  . SPERMATOCELECTOMY Right 03/18/2014   Procedure: RIGHT SPERMATOCELECTOMY;  Surgeon: Crist FatBenjamin W Herrick, MD;  [facility];  Service: Urology;  Laterality: Right;       Home Medications    Prior to Admission medications   Medication Sig Start Date End Date Taking? Authorizing Provider  diphenhydrAMINE (BENADRYL) 25 mg capsule Take 25 mg by mouth every 6 (six) hours as needed for itching.   Yes Historical Provider, MD  eszopiclone (LUNESTA) 2 MG TABS tablet Take 2 mg by mouth at bedtime as needed for sleep.  01/13/16  Yes Historical Provider, MD  HYDROcodone-acetaminophen (NORCO/VICODIN) 5-325 MG tablet Take 1 tablet by mouth every 6 (six) hours as needed for moderate pain.  05/24/16  Yes Historical Provider, MD  oxyCODONE-acetaminophen (PERCOCET/ROXICET) 5-325 MG tablet Take 2 tablets by mouth every 6 (six) hours as needed for moderate pain or severe pain. 09/12/15  Yes Tharon AquasFrank C Patrick, PA  venlafaxine XR (EFFEXOR-XR) 75 MG 24 hr capsule Take 75 mg by mouth daily. 01/08/16  Yes  Historical Provider, MD  cyclobenzaprine (FLEXERIL) 10 MG tablet Take 1 tablet (10 mg total) by mouth 2 (two) times daily as needed for muscle spasms. Patient not taking: Reported on 05/29/2016 02/08/16   Rolm Gala Barrett, PA-C  HYDROmorphone (DILAUDID) 2 MG tablet Take 1 tablet (2 mg total) by mouth every 6 (six) hours as needed for moderate pain. Patient not taking: Reported on 01/25/2016 01/05/16   Rolland Porter, MD  naproxen (NAPROSYN) 500 MG tablet Take 1 tablet (500 mg total) by mouth 2 (two) times daily as needed for mild pain, moderate pain or headache (TAKE WITH  MEALS.). Patient not taking: Reported on 01/25/2016 09/04/15   Mercedes Camprubi-Soms, PA-C  oxyCODONE-acetaminophen (PERCOCET) 5-325 MG tablet Take 1 tablet by mouth every 6 (six) hours as needed for severe pain. Patient not taking: Reported on 01/25/2016 09/04/15   Mercedes Camprubi-Soms, PA-C    Family History History reviewed. No pertinent family history.  Social History Social History  Substance Use Topics  . Smoking status: Never Smoker  . Smokeless tobacco: Former Neurosurgeon    Types: Snuff    Quit date: 01/09/2014  . Alcohol use Yes     Comment: occasional     Allergies   Patient has no known allergies.   Review of Systems Review of Systems  Constitutional: Negative for chills, fatigue and fever.  HENT: Negative for congestion and rhinorrhea.   Eyes: Negative for photophobia and visual disturbance.  Respiratory: Negative for cough, chest tightness and shortness of breath.   Cardiovascular: Negative for chest pain, palpitations and leg swelling.  Gastrointestinal: Negative for abdominal pain, diarrhea, nausea and vomiting.  Genitourinary: Negative.   Skin: Negative.   Neurological: Negative for dizziness, syncope, weakness, light-headedness and headaches.  All other systems reviewed and are negative.    Physical Exam Updated Vital Signs BP 132/94 (BP Location: Right Arm)   Pulse 104   Temp 97.4 F (36.3 C) (Oral)   Resp 14   Ht 6\' 1"  (1.854 m)   Wt 86.2 kg   SpO2 (!) 87%   BMI 25.07 kg/m   Physical Exam  Constitutional: He is oriented to person, place, and time. He appears well-developed and well-nourished. No distress.  Patient is arousable speaking complete sentences but seems drowsy.  HENT:  Head: Normocephalic and atraumatic.  Right Ear: Tympanic membrane, external ear and ear canal normal.  Left Ear: Tympanic membrane, external ear and ear canal normal.  Nose: Nose normal.  Mouth/Throat: Uvula is midline and oropharynx is clear and moist. Mucous membranes  are dry.  Smokeless tobacco noted in mouth and oropharynx.  Eyes: Conjunctivae and EOM are normal. Pupils are equal, round, and reactive to light. Right eye exhibits no discharge. Left eye exhibits no discharge. No scleral icterus.  Pupils are dilated and sluggish  Neck: Normal range of motion. Neck supple. No thyromegaly present.  Cardiovascular: Regular rhythm, normal heart sounds and intact distal pulses.  Tachycardia present.  Exam reveals no gallop and no friction rub.   No murmur heard. Pulmonary/Chest: Effort normal and breath sounds normal. Bradypnea noted. No tachypnea. No respiratory distress. He has no decreased breath sounds. He has no wheezes. He has no rhonchi. He has no rales.  On room air patient is 86%. He was tobacco 2 L and improved to 93%.  Abdominal: Soft. Bowel sounds are normal. He exhibits no distension. There is no tenderness. There is no rebound and no guarding.  Musculoskeletal: Normal range of motion.  Lymphadenopathy:    He has  no cervical adenopathy.  Neurological: He is alert and oriented to person, place, and time. GCS eye subscore is 4. GCS verbal subscore is 5. GCS motor subscore is 6.  Skin: Skin is warm and dry. Capillary refill takes less than 2 seconds.  Psychiatric: He expresses no suicidal ideation. He expresses no suicidal plans.  Nursing note and vitals reviewed.    ED Treatments / Results  Labs (all labs ordered are listed, but only abnormal results are displayed) Labs Reviewed  COMPREHENSIVE METABOLIC PANEL - Abnormal; Notable for the following:       Result Value   Potassium 3.3 (*)    Glucose, Bld 122 (*)    Calcium 8.8 (*)    All other components within normal limits  CBC WITH DIFFERENTIAL/PLATELET - Abnormal; Notable for the following:    WBC 17.9 (*)    Neutro Abs 15.4 (*)    Monocytes Absolute 1.6 (*)    All other components within normal limits  ACETAMINOPHEN LEVEL - Abnormal; Notable for the following:    Acetaminophen (Tylenol),  Serum <10 (*)    All other components within normal limits  BLOOD GAS, ARTERIAL - Abnormal; Notable for the following:    pH, Arterial 7.239 (*)    pCO2 arterial 74.7 (*)    Bicarbonate 30.8 (*)    All other components within normal limits  BLOOD GAS, ARTERIAL - Abnormal; Notable for the following:    pH, Arterial 7.330 (*)    pCO2 arterial 55.6 (*)    Bicarbonate 28.5 (*)    All other components within normal limits  CBG MONITORING, ED - Abnormal; Notable for the following:    Glucose-Capillary 230 (*)    All other components within normal limits  CULTURE, BLOOD (ROUTINE X 2)  MRSA PCR SCREENING  CULTURE, BLOOD (ROUTINE X 2)  ETHANOL  SALICYLATE LEVEL  PROCALCITONIN  URINALYSIS, ROUTINE W REFLEX MICROSCOPIC (NOT AT [facility])  RAPID URINE DRUG SCREEN, HOSP PERFORMED  COMPREHENSIVE METABOLIC PANEL  CBC  I-STAT CG4 LACTIC ACID, ED    EKG  EKG Interpretation  Date/Time:  Wednesday May 29 2016 05:48:49 EST Ventricular Rate:  95 PR Interval:    QRS Duration: 104 QT Interval:  361 QTC Calculation: 454 R Axis:   16 Text Interpretation:  Sinus rhythm RSR' in V1 or V2, probably normal variant Confirmed by Wilkie AyeHORTON  MD, Toni AmendOURTNEY ([id number]) on 05/29/2016 5:52:27 AM Also confirmed by Wilkie AyeHORTON  MD, COURTNEY ([id number]), editor [place]  CCT, BEVERLY ([id number])  on 05/29/2016 6:32:32 AM       Radiology Dg Chest 2 View  Result Date: 05/29/2016 CLINICAL DATA:  Possible aspiration. Cough and congestion. Overdose. EXAM: CHEST  2 VIEW COMPARISON:  02/07/2016 FINDINGS: The cardiomediastinal silhouette is within normal limits. The lungs are well inflated without evidence of airspace consolidation, edema, pleural effusion, or pneumothorax. No acute osseous abnormality is identified. IMPRESSION: No active cardiopulmonary disease. Electronically Signed   By: Sebastian AcheAllen  Grady M.D.   On: 05/29/2016 07:33    Procedures Procedures (including critical care time)  Medications Ordered in ED Medications   naproxen (NAPROSYN) tablet 500 mg (500 mg Oral Given 05/29/16 2054)  cyclobenzaprine (FLEXERIL) tablet 10 mg (not administered)  venlafaxine XR (EFFEXOR-XR) 24 hr capsule 75 mg (75 mg Oral Given 05/29/16 1700)  enoxaparin (LOVENOX) injection 40 mg (40 mg Subcutaneous Given 05/29/16 1700)  sodium chloride flush (NS) 0.9 % injection 3 mL (not administered)  piperacillin-tazobactam (ZOSYN) IVPB 3.375 g (3.375 g Intravenous Given 05/29/16 1446)  clotrimazole (LOTRIMIN) 1 % cream ( Topical Given 05/29/16 1447)  ondansetron (ZOFRAN) injection 4 mg (4 mg Intravenous Given 05/29/16 2053)  butalbital-acetaminophen-caffeine (FIORICET, ESGIC) 50-325-40 MG per tablet 2 tablet (not administered)  sodium chloride 0.9 % bolus 1,000 mL (0 mLs Intravenous Stopped 05/29/16 0740)  sodium chloride 0.9 % bolus 1,000 mL (0 mLs Intravenous Stopped 05/29/16 1236)  naloxone (NARCAN) injection 0.4 mg (0.4 mg Intravenous Given 05/29/16 1051)     Initial Impression / Assessment and Plan / ED Course  I have reviewed the triage vital signs and the nursing notes.  Pertinent labs & imaging results that were available during my care of the patient were reviewed by me and considered in my medical decision making (see chart for details).  Clinical Course   Patient presents to ED with unintentional drug overdose. Patient was noted to be hypoxic on EMS arrival. Narcan ws given with improvement in patient's alertness. Patient was noted to have continued hypoxia given nasal cannula in ED. His oxygen levels dipped into the upper 70s on 2 L of oxygen. He was placed on a non rebreather that improved to assess to 94%. And end tidal CO2 was placed that showed 60-80 range. Chest x-ray showed no acute cardiopulmonary process.Concern for aspiration pneumonia versus worsening respiratory depression due to medication. Abx started  ABG was obtained that showed ph of 7.23 and co2 of 74. Patient was placed on BiPAP following hospitalization  recomendations. Patient lactic acid was 1.5. Tylenol and salicylates levels were unremarkable. Mild leukocytosis of 17.All other labs were unremarkable.EKG without acute changes. Patient was evaluated and assessed by Dr. Dalene Seltzer who agrees to be a plan. Patient was discussed with Dr. Allen Norris who agrees to admit patient to the hospital. Patient is hemodynamically stable at this time. He is agreeable to admission.     Final Clinical Impressions(s) / ED Diagnoses   Final diagnoses:  Acute respiratory failure with hypercapnia (HCC)  PNA (pneumonia)    New Prescriptions New Prescriptions   No medications on file     Rise Mu, PA-C 05/30/16 1819    Alvira Monday, MD 06/05/16 2249

## 2016-05-29 NOTE — ED Notes (Signed)
Patient transported to X-ray 

## 2016-05-29 NOTE — ED Notes (Signed)
Pt attempted to provide urine sample, was unsuccessful. Will try again in a little while.

## 2016-05-29 NOTE — Consult Note (Signed)
PULMONARY / CRITICAL CARE MEDICINE   Name: Roger KleinJohn Gonzales MRN: [id number] DOB: 06/01/1971    ADMISSION DATE:  05/29/2016 CONSULTATION DATE:  05/29/16  REFERRING MD:  Dr. 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   The patient reports he recently had a cervical spine fusion per Dr. Yetta BarreJones.  He states he had a reduction in his pain regimen recently but had been having ongoing pain.  He reports he took three Vicodin on 11/7 for neck pain.  In addition, he had developed a rash in his groin that he had taken 75 mg of benadryl for x2 (150 mg total) on 11/7.  Evening of 11/7 he also drank cranberry and vodka.  His wife reports she slept downstairs due to his snoring. She woke at 4 am to get ready for work and found that he had fallen out of bed and wedged between the bed.  She noted his breathing to be altered and called 911.  Dispatcher instructed her to perform CPR.  She performed "a couple of breaths but could not see his chest rise and performed compressions for approximately 1 minute".  EMS arrived at this point and he was given 2 mg Narcan and responded well.  The patient was noted to have smokeless tobacco in his mouth on scene.  Wife reports pt only took benadryl but EMS found empty bottles of percocet and norco on scene. On arrival to the ER, Poison Control was notified and recommended monitoring & narcan.  Initial labs - Na 140, K 3.3, Cl 102, sr cr 0.90, glucose 122, AST 38 / ALT 43, lactic acid 1.51, WBC 17.9, Hgb 15, and platelets 204.  Initial CXR was negative for acute process.  ABG 7.239 / 74 / 102 / 30.8.  He was treated with repeat narcan in the ER and BiPAP with significant improvement in his mental status.   [facility]  status and concerns for airway protection.  PAST MEDICAL HISTORY :  He  has a past medical history of DDD (degenerative disc disease), cervical; Epididymal cyst; and Pinched nerve.  PAST SURGICAL HISTORY: He  has a past surgical history that includes Reconstruction mandible (age 45); REMOVAL LIPOMA, BACK (03/ 2014); REMOVAL HARDWARE RIGHT UPPER MANDIBLE (04/ 2015); Spermatocelectomy (Right, 03/18/2014); and Cervical fusion (04/12/2016).  No Known Allergies  No current facility-administered medications on file prior to encounter.    Current Outpatient Prescriptions on File Prior to Encounter  Medication Sig  . eszopiclone (LUNESTA) 2 MG TABS tablet Take 2 mg by mouth at bedtime as needed for sleep.   Marland Kitchen. oxyCODONE-acetaminophen (PERCOCET/ROXICET) 5-325 MG tablet Take 2 tablets by mouth every 6 (six) hours as needed for moderate pain or severe pain.  Marland Kitchen. venlafaxine XR (EFFEXOR-XR) 75 MG 24 hr capsule Take 75 mg by mouth daily.  . cyclobenzaprine (FLEXERIL) 10 MG tablet Take 1 tablet (10 mg total) by mouth 2 (two) times daily as needed for muscle spasms. (Patient not taking: Reported on 05/29/2016)  . HYDROmorphone (DILAUDID) 2 MG tablet Take 1 tablet (2 mg total) by mouth every 6 (six) hours as needed for moderate pain. (Patient not taking: Reported on 01/25/2016)  . naproxen (NAPROSYN) 500 MG tablet Take 1 tablet (  500 mg total) by mouth 2 (two) times daily as needed for mild pain, moderate pain or headache (TAKE WITH MEALS.). (Patient not taking: Reported on 01/25/2016)  . oxyCODONE-acetaminophen (PERCOCET) 5-325 MG tablet Take 1 tablet by mouth every 6 (six) hours as needed for severe pain. (Patient not taking: Reported on 01/25/2016)    FAMILY HISTORY:  His has no family status information on file.    SOCIAL HISTORY: He  reports that he has never smoked. He quit smokeless tobacco use about 2 years ago. His smokeless tobacco use included Snuff. He reports that he drinks alcohol. He reports that he  does not use drugs.  REVIEW OF SYSTEMS:  POSITIVES IN BOLD Gen: Denies fever, chills, weight change, fatigue, night sweats HEENT: Denies blurred vision, double vision, hearing loss, tinnitus, sinus congestion, rhinorrhea, sore throat, neck stiffness, dysphagia PULM: Denies shortness of breath, cough, sputum production, hemoptysis, wheezing CV: Denies chest pain, edema, orthopnea, paroxysmal nocturnal dyspnea, palpitations GI: Denies abdominal pain, nausea, vomiting, diarrhea, hematochezia, melena, constipation, change in bowel habits GU: Denies dysuria, hematuria, polyuria, oliguria, urethral discharge Endocrine: Denies hot or cold intolerance, polyuria, polyphagia or appetite change Derm: Denies rash, dry skin, scaling or peeling skin change Heme: Denies easy bruising, bleeding, bleeding gums Neuro: Denies headache, numbness, weakness, slurred speech, loss of memory or consciousness.  Neck pain (unchanged from chronic) .   SUBJECTIVE:  Pt reports ongoing neck pain / discomfort  VITAL SIGNS: BP 142/88   Pulse 105   Temp 97.4 F (36.3 C) (Oral)   Resp 19   Ht 6\' 1"  (1.854 m)   Wt 190 lb (86.2 kg)   SpO2 98%   BMI 25.07 kg/m   HEMODYNAMICS:    VENTILATOR SETTINGS:    INTAKE / OUTPUT: No intake/output data recorded.  PHYSICAL EXAMINATION: General:  Well developed male in NAD  Neuro:  AAOx4, speech clear, MAE, pupils 5-6 mm HEENT:  MM pink/moist, no jvd, bipap in place  Cardiovascular:  s1s2 rrr, no m/r/g  Lungs:  Even/non-labored, lungs bilaterally clear Abdomen:  Soft, non-tender, bsx4 active  Musculoskeletal:  No acute deformities  Skin:  Warm/dry, no edema   LABS:  BMET  Recent Labs Lab 05/29/16 0430  NA 140  K 3.3*  CL 102  CO2 29  BUN 14  CREATININE 0.90  GLUCOSE 122*    Electrolytes  Recent Labs Lab 05/29/16 0430  CALCIUM 8.8*    CBC  Recent Labs Lab 05/29/16 0430  WBC 17.9*  HGB 15.0  HCT 42.7  PLT 204    Coag's No results for  input(s): APTT, INR in the last 168 hours.  Sepsis Markers  Recent Labs Lab 05/29/16 0830  LATICACIDVEN 1.51    ABG  Recent Labs Lab 05/29/16 1025  PHART 7.239*  PCO2ART 74.7*  PO2ART 102    Liver Enzymes  Recent Labs Lab 05/29/16 0430  AST 38  ALT 43  ALKPHOS 84  BILITOT 0.4  ALBUMIN 4.6    Cardiac Enzymes No results for input(s): TROPONINI, PROBNP in the last 168 hours.  Glucose  Recent Labs Lab 05/29/16 0552  GLUCAP 230*    Imaging Dg Chest 2 View  Result Date: 05/29/2016 CLINICAL DATA:  Possible aspiration. Cough and congestion. Overdose. EXAM: CHEST  2 VIEW COMPARISON:  02/07/2016 FINDINGS: The cardiomediastinal silhouette is within normal limits. The lungs are well inflated without evidence of airspace consolidation, edema, pleural effusion, or pneumothorax. No acute osseous abnormality is identified. IMPRESSION: No active cardiopulmonary disease. Electronically Signed  By: Sebastian Ache M.D.   On: 05/29/2016 07:33     STUDIES:    CULTURES:   ANTIBIOTICS: Zosyn 11/8 >>   SIGNIFICANT EVENTS: 11/08  Admit with AMS, unintentional OD.  Responded to narcan  LINES/TUBES:   DISCUSSION: 44 y/o M with PMH of DDD s/p cervical spine fusion 04/12/16 per Dr. Yetta Barre admitted 11/8 with unintentional overdose of narcotics, benadryl and alcohol.    ASSESSMENT / PLAN:  PULMONARY A: Acute Hypercarbic Respiratory Failure - in the setting of narcotics, ETOH and benadryl R/O Aspiration - found with smokeless tobacco in mouth altered  ? OSA - hx of snoring, may simply be related to narcotics but will need f/u P:   Continue BiPAP for ~ 3-4 hours and then allow for break if mental status remains intact Pulmonary hygiene Follow up CXR in am  Monitor for potential intubation  Follow up regarding OSA with Pulmonary as outpatient   CARDIOVASCULAR A:  S/P Chest Compressions P:  SDU monitoring Monitor QTc with PSY agents   RENAL A:   Hypokalemia P:    Trend BMP / UOP  Replace electrolytes as indicated   GASTROINTESTINAL A:   No Acute Issues  P:   Aspiration precautions while on BiPAP  NPO   HEMATOLOGIC A:   No Acute Issues  P:  Monitor CBC   INFECTIOUS A:   R/O Aspiration  Fungal Groin Rash P:   Empiric zosyn for aspiration coverage Trend PCT If CXR remains negative in am, consider narrowing vs d/c abx Clotrimazole to groin   ENDOCRINE A:   Mild Hyperglycemia   P:   Monitor glucose on BMP   NEUROLOGIC A:   Overdose - suspected unintentional but not able to verify with patient Chronic Pain  Cervical Spine Fusion - 9/22 Hx Depression / Anxiety  P:   RASS goal: 0 Hold sedating medications / narcotics Monitor neuro status  Hold antidepressants 11/8 Will need to be seen by Pain Clinic or similar at discharge  Assess UDS, ETOH   FAMILY  - Updates: Patient and wife Misty Stanley) updated at bedside.   CC Time: 30 minutes   Canary Brim, NP-C [facility] Pgr: [phone] or if no answer [phone] 05/29/2016, 10:46 AM

## 2016-05-29 NOTE — ED Notes (Addendum)
Poison Control notified Rosiland Oz(Roshonna, RN), Recommendations: narcan as needed. Observe 6 hrs post every narcan administration. EKG, acetaminophen level, CMP, and repeate EKG to ensure no further widening of QRS.

## 2016-05-29 NOTE — ED Triage Notes (Signed)
Per EMS pt was found on the floor by his wife, upon their arrival he had agonal breathing, 2mg  of Narcan was given to  which pt responded, per EMS pt also had smokeless tobacco in his mouth, and he may have possibly aspirate some of it when he vomited. Per EMS pt's wife sts he only took Benadryl, however EMS reports pt had rx for percocet and norco and both bottles were empty.

## 2016-05-29 NOTE — Progress Notes (Signed)
Pt observed heart rate on the monitor decreased from NSR in the 60's to sinus bradycardia. Lowest heart rate seen was 38. Entered patient room and patient was observed sleeping. Upon checking on patient, patient stated that he felt fine. When awake, heart rate entered back into normal sinus rhythm. MD paged. No further at time. All other vital signs remain stable. Will continue to follow up.

## 2016-05-29 NOTE — Progress Notes (Signed)
Spoke with MD regarding patients heart rate. MD stated he is okay with patient being in 30's and 40's as long as he is not sustaining. All other VSS. Will continue to monitor. No further orders at this time.

## 2016-05-29 NOTE — H&P (Addendum)
[facility] History and Physical  Sanda KleinJohn Toner WUJ:[id number] DOB: 05/25/1971 DOA: 05/29/2016  Referring physician: ED PCP: Alysia PennaHOLWERDA, SCOTT, MD  Specialists: CCM consulted on admit  Chief Complaint: found down  HPI:  45 y/o ? Known Cervical DDD and chronic neck and back pain He apparently has followed in tha past with pain management however Dr. Yetta BarreJones currently Rx all of his pain meds Multiple emergency room visits from June this year On each visit has been given a medications morphine as well as other pain management  Patient  found by his wife earlier this morning to have agonal breathing  4 AM and he was nonresponsive EMS was called and chest compressions were briefly started was given 2 mg of Narcan found to have smokeless tobacco in his mouth he was refilled recently pain medications on November 3  He is inconsistently awake and seems to fall back to sleep tells me that he had taken 75 mg 05/28/2016 a.m. and 05/28/2016 p.m. for a rash in his groin Never been diagnosed with sleep apnea in the past This type of issue has never happened to him before He does not have any other significant medical illnesses however is on many other psychotropic medications   On admission lactic acid 1.5 Tylenol level less than 10 and salicylate level is 7 alcohol level less than 5 glucose level 230 Chest x-ray no acute cardiopulmonary disease  His oxygen level dipped into the 80s on 2 L of oxygen he needs to be placed on nonrebreather his ET CO2 is in the 60-80 range   White count 17 thousand 15 platelet 204 Potassium 3.3 BUN/creatinine 14/0.9  Review of systems is not entirely available as patient sleepy however tells me has no chest pain no recent cough no chills. 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 DDD (degenerative disc disease), cervical   .  Epididymal cyst    RIGHT  . Pinched nerve    cervical   Past Surgical History:  Procedure Laterality Date  . CERVICAL FUSION  04/12/2016  . RECONSTRUCTION MANDIBLE  age 45   correct bite bilateral upper  . REMOVAL HARDWARE RIGHT UPPER MANDIBLE  04/ 2015  . REMOVAL LIPOMA, BACK  03/ 2014  . SPERMATOCELECTOMY Right 03/18/2014   Procedure: RIGHT SPERMATOCELECTOMY;  Surgeon: Crist FatBenjamin W Herrick, MD;  [facility];  Service: Urology;  Laterality: Right;   Social History:  Social History   Social History Narrative  . No narrative on file    No Known Allergies  History reviewed. No pertinent family history.   Prior to Admission medications   Medication Sig Start Date End Date Taking? Authorizing Provider  diphenhydrAMINE (BENADRYL) 25 mg capsule Take 25 mg by mouth every 6 (six) hours as needed for itching.   Yes Historical Provider, MD  eszopiclone (LUNESTA) 2 MG TABS tablet Take 2 mg by mouth at bedtime as needed for sleep.  01/13/16  Yes Historical Provider, MD  HYDROcodone-acetaminophen (NORCO/VICODIN) 5-325 MG tablet Take 1 tablet by mouth every 6 (six) hours as needed for moderate pain.  05/24/16  Yes Historical Provider, MD  oxyCODONE-acetaminophen (PERCOCET/ROXICET) 5-325 MG tablet Take 2 tablets by mouth every 6 (six) hours as needed for moderate pain or severe pain. 09/12/15  Yes Tharon AquasFrank C Patrick, PA  venlafaxine XR (EFFEXOR-XR) 75 MG 24 hr  capsule Take 75 mg by mouth daily. 01/08/16  Yes Historical Provider, MD  cyclobenzaprine (FLEXERIL) 10 MG tablet Take 1 tablet (10 mg total) by mouth 2 (two) times daily as needed for muscle spasms. Patient not taking: Reported on 05/29/2016 02/08/16   Rolm Gala Barrett, PA-C  HYDROmorphone (DILAUDID) 2 MG tablet Take 1 tablet (2 mg total) by mouth every 6 (six) hours as needed for moderate pain. Patient not taking: Reported on 01/25/2016 01/05/16   Rolland Porter, MD  naproxen (NAPROSYN) 500 MG tablet Take 1 tablet (500 mg total) by  mouth 2 (two) times daily as needed for mild pain, moderate pain or headache (TAKE WITH MEALS.). Patient not taking: Reported on 01/25/2016 09/04/15   Mercedes Camprubi-Soms, PA-C  oxyCODONE-acetaminophen (PERCOCET) 5-325 MG tablet Take 1 tablet by mouth every 6 (six) hours as needed for severe pain. Patient not taking: Reported on 01/25/2016 09/04/15   Mercedes Camprubi-Soms, PA-C   Physical Exam: Vitals:   05/29/16 0549 05/29/16 0551 05/29/16 0723 05/29/16 1032  BP: 132/94  125/95 142/88  Pulse: 104  106 105  Resp: 14  20 19   Temp: 97.4 F (36.3 C)     TempSrc: Oral     SpO2: (!) 87%  90% 98%  Weight:  86.2 kg (190 lb)    Height:  6\' 1"  (1.854 m)      On exam Somnolent awake at times with falls right back to sleep Pupils equally reactive to light about 2-3 mm Thick neck Mallampati  No JVD s1 S2 no murmur or gallop Chest clear No lower extremity edema Neuro moves all 4 limbs and follows commands well when he is more awake  Labs on Admission:  Basic Metabolic Panel:  Recent Labs Lab 05/29/16 0430  NA 140  K 3.3*  CL 102  CO2 29  GLUCOSE 122*  BUN 14  CREATININE 0.90  CALCIUM 8.8*   Liver Function Tests:  Recent Labs Lab 05/29/16 0430  AST 38  ALT 43  ALKPHOS 84  BILITOT 0.4  PROT 7.7  ALBUMIN 4.6   No results for input(s): LIPASE, AMYLASE in the last 168 hours. No results for input(s): AMMONIA in the last 168 hours. CBC:  Recent Labs Lab 05/29/16 0430  WBC 17.9*  NEUTROABS 15.4*  HGB 15.0  HCT 42.7  MCV 89.1  PLT 204   Cardiac Enzymes: No results for input(s): CKTOTAL, CKMB, CKMBINDEX, TROPONINI in the last 168 hours.  BNP (last 3 results) No results for input(s): BNP in the last 8760 hours.  ProBNP (last 3 results) No results for input(s): PROBNP in the last 8760 hours.  CBG:  Recent Labs Lab 05/29/16 0552  GLUCAP 230*    Radiological Exams on Admission: Dg Chest 2 View  Result Date: 05/29/2016 CLINICAL DATA:  Possible aspiration.  Cough and congestion. Overdose. EXAM: CHEST  2 VIEW COMPARISON:  02/07/2016 FINDINGS: The cardiomediastinal silhouette is within normal limits. The lungs are well inflated without evidence of airspace consolidation, edema, pleural effusion, or pneumothorax. No acute osseous abnormality is identified. IMPRESSION: No active cardiopulmonary disease. Electronically Signed   By: Sebastian Ache M.D.   On: 05/29/2016 07:33    EKG: Independently reviewed. Sinus tachycardia rate about 110, PR interval 0.12, QRS axis 20, rate related ST-T wave changes with RSR prime  Assessment/Plan  Acute type I and type II respiratory failure secondary to unintentional drug overdose with Benadryl superimposed on multiple other medications-blood gas shows pH 7. 2 4, PCO2 75 and patient needed to  be placed on nonrebreather Place on BiPAP Repeat ABG in an hour and a half Hold narcotics Keep nothing by mouth Patient is at high risk for decompensation and further worsening of critical condition and I have consulted critical care to evaluate the patient in consult  S/p CPR Monitor patient on step down Repeat EKG/troponins if needed. would not perform unless patient symptomatic  Chronic pain syndrome Patient is present at least 6 times in the past couple months to the emergency room he is on Norco, Percocet, apparently on Dilaudid in the past and has prescriptions for Lunesta, Flexeril and recently self medicated with Benadryl. He will need a chronic pain physician on discharge I will send an FYI to Dr. Yetta BarreJones who prescribes his current medications  Leukocytosis x-ray does not show any specific pneumonia and I do not think he has pneumonia at this stage This could be related to the stress of CPR Repeat 2 view x-ray in morning Reasonable to place on Zosyn for right now given history of being found with tobacco in his mouth and concern for aspiration  ? Obstructive sleep apnea Patient will need to be assessed as an  outpatient for the same  Monitor QTC while on multiple agents with propensity for QTC prolongation   Full CODE STATUS Place on BiPAP and Lovenox for DVT prophylaxis Nothing by mouth Await evaluation by critical care medicine Hold narcotics for now   Time spent: 55 ` .CRITICAL CARE Performed by: Rhetta MuraSAMTANI, JAI-GURMUKH   Total critical care time: 55 minutes  Critical care time was exclusive of separately billable procedures and treating other patients.  Critical care was necessary to treat or prevent imminent or life-threatening deterioration.  Critical care was time spent personally by me on the following activities: development of treatment plan with patient and/or surrogate as well as nursing, discussions with consultants, evaluation of patient's response to treatment, examination of patient, obtaining history from patient or surrogate, ordering and performing treatments and interventions, ordering and review of laboratory studies, ordering and review of radiographic studies, pulse oximetry and re-evaluation of patient's condition.   Rhetta MuraSAMTANI, JAI-GURMUKH [facility] Pager [phone]  If 7PM-7AM, please contact night-coverage [email or web address] Password [email or web address] 05/29/2016, 10:47 AM

## 2016-05-29 NOTE — ED Notes (Signed)
Bed: WA09 Expected date:  Expected time:  Means of arrival:  Comments: Overdose

## 2016-05-29 NOTE — ED Notes (Signed)
Patient informed that urine sample is needed. 

## 2016-05-29 NOTE — Progress Notes (Signed)
Pt resting comfortably on 3 LPM [place], Pt alert and oriented.  BIPAP not needed at this time, RT to monitor and assess as needed.

## 2016-05-30 ENCOUNTER — Inpatient Hospital Stay (HOSPITAL_COMMUNITY): Payer: BLUE CROSS/BLUE SHIELD

## 2016-05-30 DIAGNOSIS — R066 Hiccough: Secondary | ICD-10-CM

## 2016-05-30 DIAGNOSIS — J986 Disorders of diaphragm: Secondary | ICD-10-CM

## 2016-05-30 DIAGNOSIS — J189 Pneumonia, unspecified organism: Secondary | ICD-10-CM

## 2016-05-30 LAB — COMPREHENSIVE METABOLIC PANEL
ALT: 28 U/L (ref 17–63)
ANION GAP: 7 (ref 5–15)
AST: 17 U/L (ref 15–41)
Albumin: 3.9 g/dL (ref 3.5–5.0)
Alkaline Phosphatase: 63 U/L (ref 38–126)
BUN: 11 mg/dL (ref 6–20)
CALCIUM: 8.6 mg/dL — AB (ref 8.9–10.3)
CHLORIDE: 96 mmol/L — AB (ref 101–111)
CO2: 35 mmol/L — AB (ref 22–32)
Creatinine, Ser: 0.97 mg/dL (ref 0.61–1.24)
GFR calc non Af Amer: >60 mL/min (ref >60)
Glucose, Bld: 119 mg/dL — ABNORMAL HIGH (ref 65–99)
POTASSIUM: 3.8 mmol/L (ref 3.5–5.1)
SODIUM: 138 mmol/L (ref 135–145)
Total Bilirubin: 1 mg/dL (ref 0.3–1.2)
Total Protein: 6.3 g/dL — ABNORMAL LOW (ref 6.5–8.1)

## 2016-05-30 LAB — URINALYSIS, ROUTINE W REFLEX MICROSCOPIC
BILIRUBIN URINE: NEGATIVE
Glucose, UA: 100 mg/dL — AB
Hgb urine dipstick: NEGATIVE
KETONES UR: NEGATIVE mg/dL
Leukocytes, UA: NEGATIVE
NITRITE: NEGATIVE
PH: 5.5 (ref 5.0–8.0)
Protein, ur: NEGATIVE mg/dL
Specific Gravity, Urine: 1.025 (ref 1.005–1.030)

## 2016-05-30 LAB — RAPID URINE DRUG SCREEN, HOSP PERFORMED
Amphetamines: NOT DETECTED
BARBITURATES: NOT DETECTED
Benzodiazepines: NOT DETECTED
COCAINE: NOT DETECTED
Opiates: POSITIVE — AB
Tetrahydrocannabinol: NOT DETECTED

## 2016-05-30 LAB — CBC
HEMATOCRIT: 36.1 % — AB (ref 39.0–52.0)
HEMOGLOBIN: 12.3 g/dL — AB (ref 13.0–17.0)
MCH: 31 pg (ref 26.0–34.0)
MCHC: 34.1 g/dL (ref 30.0–36.0)
MCV: 90.9 fL (ref 78.0–100.0)
Platelets: 160 10*3/uL (ref 150–400)
RBC: 3.97 MIL/uL — AB (ref 4.22–5.81)
RDW: 13 % (ref 11.5–15.5)
WBC: 11.4 10*3/uL — ABNORMAL HIGH (ref 4.0–10.5)

## 2016-05-30 MED ORDER — METHOCARBAMOL 500 MG PO TABS: 500.0000 mg | ORAL_TABLET | Freq: Three times a day (TID) | ORAL | 0 refills | Status: DC | PRN

## 2016-05-30 MED ORDER — AMOXICILLIN-POT CLAVULANATE 875-125 MG PO TABS: 1.0000 | ORAL_TABLET | Freq: Two times a day (BID) | ORAL | 0 refills | Status: AC

## 2016-05-30 MED ORDER — LORAZEPAM 2 MG/ML IJ SOLN
1.0000 mg | INTRAMUSCULAR | Status: AC
  Administered 2016-05-30: 1 mg via INTRAVENOUS
  Filled 2016-05-30: qty 1

## 2016-05-30 MED ORDER — METOCLOPRAMIDE HCL 5 MG/ML IJ SOLN
10.0000 mg | INTRAMUSCULAR | Status: AC
  Administered 2016-05-30: 10 mg via INTRAVENOUS
  Filled 2016-05-30: qty 2

## 2016-05-30 MED ORDER — DIAZEPAM 5 MG PO TABS
2.5000 mg | ORAL_TABLET | Freq: Once | ORAL | Status: AC
  Administered 2016-05-30: 2.5 mg via ORAL
  Filled 2016-05-30: qty 1

## 2016-05-30 NOTE — Progress Notes (Signed)
Pt sleeping and bradying down into 40s-50s. EKG showed NSR. Periods of junctional rhythm. Saved strip of junctional rhythm from the monitor in pt's chart. Pt asymptomatic. Notified NP. Will continue to monitor pt closely.

## 2016-05-30 NOTE — Consult Note (Signed)
Neurology Consultation Reason for Consult: Spasms Referring Physician: Ronalee BeltsBhandari, D  CC: Spasms  History is obtained from: Patient  HPI: Roger Gonzales is a 45 y.o. male with a history of cervical disc disease who has had now several episodes of spasms. He states that with each episode he has presented to the ER, received medication (last time he received Decadron, Ativan, morphine). There was stopped within 45 minutes or so.  Last night, he began having them again, received a small dose of oral volume without result. He received another dose oral Valium this morning. He continues to have spasms which are interfering with his ability to eat, etc.  He is in the hospital because of overdose, but has improved.   ROS: A 14 point ROS was performed and is negative except as noted in the HPI.   Past Medical History:  Diagnosis Date  . DDD (degenerative disc disease), cervical   . Epididymal cyst    RIGHT  . Pinched nerve    cervical     Family history: No history of similar episodes   Social History:  reports that he has never smoked. His smokeless tobacco use includes Chew. He reports that he drinks alcohol. He reports that he does not use drugs.   Exam: Current vital signs: BP 133/81   Pulse 90   Temp 98.3 F (36.8 C) (Oral)   Resp (!) 22   Ht 6\' 1"  (1.854 m)   Wt 91.5 kg (201 lb 11.5 oz)   SpO2 98%   BMI 26.61 kg/m  Vital signs in last 24 hours: Temp:  [97.4 F (36.3 C)-99.1 F (37.3 C)] 98.3 F (36.8 C) (11/09 1200) Pulse Rate:  [50-90] 90 (11/09 1100) Resp:  [11-34] 22 (11/09 1100) BP: (123-137)/(62-100) 133/81 (11/09 1000) SpO2:  [84 %-100 %] 98 % (11/09 1100) Weight:  [91.5 kg (201 lb 11.5 oz)] 91.5 kg (201 lb 11.5 oz) (11/08 1435)   Physical Exam  Constitutional: Appears well-developed and well-nourished.  Psych: Affect appropriate to situation Eyes: No scleral injection HENT: No OP obstrucion Head: Normocephalic.  Cardiovascular: Normal rate and regular  rhythm.  Respiratory: Effort normal and breath sounds normal to anterior ascultation GI: Soft.  No distension. There is no tenderness.  Skin: WDI  Neuro: Mental Status: Patient is awake, alert, oriented to person, place, month, year, and situation. Patient is able to give a clear and coherent history. No signs of aphasia or neglect Cranial Nerves: II: Visual Fields are full. Slightly asymmetric pupils, larger on right, < 1mm different.  III,IV, VI: EOMI without ptosis or diploplia.  V: Facial sensation is symmetric to temperature VII: Facial movement is symmetric.  VIII: hearing is intact to voice X: Uvula elevates symmetrically XI: Shoulder shrug is symmetric. XII: tongue is midline without atrophy or fasciculations.  Motor: Tone is normal. Bulk is normal. 5/5 strength was present in all four extremities.  Sensory: Sensation is symmetric to light touch and temperature in the arms and legs. Deep Tendon Reflexes: 2+ and symmetric in the biceps and patellae.  Plantars: Toes are downgoing bilaterally.  Cerebellar: FNF and HKS are intact bilaterally  He has recurrent spasms consisting of a sudden inhalation and tightening of his neck muscles. These are extremely brief. He has no other myoclonus.    I have reviewed labs in epic and the results pertinent to this consultation are: Normal creatinine  Impression: 45 yo M with what I suspect are diaphragmatic spasms. Since they've responded well to the benzos in  the past, I think repeating this is probably worthwhile. I will also use a single dose of Reglan as and a dopaminergic skin sometimes be helpful as well.  Recommendations: 1) Ativan 1 mg IV, Reglan 10 mg IV 2) if no improvement, could consider baclofen 5-10 mg by mouth. 3) gabapentin could also be tried, that he has not found this successful in the past. 4) please call for further recommendations if no results.   Ritta SlotMcNeill Diantha Paxson, MD [facility] [phone]  If 7pm- 7am, please page neurology on call as listed in AMION.

## 2016-05-30 NOTE — Care Management Note (Signed)
Case Management Note  Patient Details  Name: Roger KleinJohn Gonzales MRN: [id number] Date of Birth: 11/26/1970  Subjective/Objective:     overdose               Action/Plan:   Expected Discharge Date:   (unknown)               Expected Discharge Plan:  Home/Self Care  In-House Referral:     Discharge planning Services     Post Acute Care Choice:    Choice offered to:     DME Arranged:    DME Agency:     HH Arranged:    HH Agency:     Status of Service:  In process, will continue to follow  If discussed at Long Length of Stay Meetings, dates discussed:    Additional Comments:  Golda AcreDavis, Susie Pousson Lynn, RN 05/30/2016, 10:43 AM

## 2016-05-30 NOTE — Discharge Instructions (Signed)
° °  Followup PA and lateral chest X-ray  °is recommended in 3-4 weeks following trial of antibiotic therapy to  °ensure resolution and exclude underlying malignancy.  ° °

## 2016-05-30 NOTE — Progress Notes (Signed)
Pt discharged to home. Pt and wife present for education. Pt verbalizes understanding of all discharge information. Prescriptions given to patient. IV removed. All patient belongings sent with patient. All vital signs stable. Patient is not in pain or any acute distress at time of discharge. Pt transported via wheelchair.

## 2016-05-30 NOTE — Progress Notes (Signed)
PULMONARY / CRITICAL CARE MEDICINE   Name: Roger Gonzales MRN: [id number] DOB: 09/05/1970    ADMISSION DATE:  05/29/2016 CONSULTATION DATE:  05/29/16  REFERRING MD:  Dr. 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   The patient reports he recently had a cervical spine fusion per Dr. Yetta BarreJones.  He states he had a reduction in his pain regimen recently but had been having ongoing pain.  He reports he took three Vicodin on 11/7 for neck pain.  In addition, he had developed a rash in his groin that he had taken 75 mg of benadryl for x2 (150 mg total) on 11/7.  Evening of 11/7 he also drank cranberry and vodka.  His wife reports she slept downstairs due to his snoring. She woke at 4 am to get ready for work and found that he had fallen out of bed and wedged between the bed.  She noted his breathing to be altered and called 911.  Dispatcher instructed her to perform CPR.  She performed "a couple of breaths but could not see his chest rise and performed compressions for approximately 1 minute".  EMS arrived at this point and he was given 2 mg Narcan and responded well.  The patient was noted to have smokeless tobacco in his mouth on scene.  Wife reports pt only took benadryl but EMS found empty bottles of percocet and norco on scene. On arrival to the ER, Poison Control was notified and recommended monitoring & narcan.  Initial labs - Na 140, K 3.3, Cl 102, sr cr 0.90, glucose 122, AST 38 / ALT 43, lactic acid 1.51, WBC 17.9, Hgb 15, and platelets 204.  Initial CXR was negative for acute process.  ABG 7.239 / 74 / 102 / 30.8.  He was treated with repeat narcan in the ER and BiPAP with significant improvement in his mental status.   [facility]  status and concerns for airway protection.    SUBJECTIVE:  Pt reports feeling much better.  Hopeful to go home soon.  Notes "diaphragmatic spasms" - he has had these before his neck surgery but this is the first time they have returned.  Denies fevers/chills.   VITAL SIGNS: BP (!) 128/100 (BP Location: Left Arm)   Pulse 85   Temp 98 F (36.7 C) (Oral)   Resp 12   Ht 6\' 1"  (1.854 m)   Wt 201 lb 11.5 oz (91.5 kg)   SpO2 94%   BMI 26.61 kg/m   HEMODYNAMICS:    VENTILATOR SETTINGS: Vent Mode: BIPAP FiO2 (%):  [50 %] 50 % Set Rate:  [15 bmp] 15 bmp PEEP:  [5 cmH20] 5 cmH20  INTAKE / OUTPUT: I/O last 3 completed shifts: In: 353 [P.O.:120; I.V.:3; Other:80; IV Piggyback:150] Out: 1200 [Urine:1200]  PHYSICAL EXAMINATION: General:  Well developed male in NAD  Neuro:  AAOx4, speech clear, MAE, pupils 4 mm.  Pt intermittently experiencing a spasm like movement of the upper body > at time only abd involved, others arms/neck and face to the point the lip movement affects his speech.  Appears almost tic like.   HEENT:  MM pink/moist, no jvd  Cardiovascular:  s1s2 rrr, no m/r/g  Lungs:  Even/non-labored,  lungs bilaterally clear Abdomen:  Soft, non-tender, bsx4 active  Musculoskeletal:  No acute deformities  Skin:  Warm/dry, no edema   LABS:  BMET  Recent Labs Lab 05/29/16 0430 05/30/16 0317  NA 140 138  K 3.3* 3.8  CL 102 96*  CO2 29 35*  BUN 14 11  CREATININE 0.90 0.97  GLUCOSE 122* 119*    Electrolytes  Recent Labs Lab 05/29/16 0430 05/30/16 0317  CALCIUM 8.8* 8.6*    CBC  Recent Labs Lab 05/29/16 0430 05/30/16 0317  WBC 17.9* 11.4*  HGB 15.0 12.3*  HCT 42.7 36.1*  PLT 204 160    Coag's No results for input(s): APTT, INR in the last 168 hours.  Sepsis Markers  Recent Labs Lab 05/29/16 0823 05/29/16 0830  LATICACIDVEN  --  1.51  PROCALCITON 0.48  --     ABG  Recent Labs Lab 05/29/16 1025 05/29/16 1220  PHART 7.239* 7.330*  PCO2ART  74.7* 55.6*  PO2ART 102 100    Liver Enzymes  Recent Labs Lab 05/29/16 0430 05/30/16 0317  AST 38 17  ALT 43 28  ALKPHOS 84 63  BILITOT 0.4 1.0  ALBUMIN 4.6 3.9    Cardiac Enzymes No results for input(s): TROPONINI, PROBNP in the last 168 hours.  Glucose  Recent Labs Lab 05/29/16 0552  GLUCAP 230*    Imaging No results found.   STUDIES:    CULTURES:   ANTIBIOTICS: Zosyn 11/8 >>   SIGNIFICANT EVENTS: 11/08  Admit with AMS, unintentional OD.  Responded to narcan  LINES/TUBES:   DISCUSSION: 45 y/o M with PMH of DDD s/p cervical spine fusion 04/12/16 per Dr. Yetta Barre admitted 11/8 with unintentional overdose of narcotics, benadryl and alcohol.    ASSESSMENT / PLAN:  PULMONARY A: Acute Hypercarbic Respiratory Failure - in the setting of narcotics, ETOH and benadryl R/O Aspiration - found with smokeless tobacco in mouth altered  ? OSA - hx of snoring, may simply be related to narcotics but will need f/u P:   Pulmonary hygiene - IS Follow up CXR in am  Follow up regarding OSA with Pulmonary as outpatient  Await CXR findings 11/9  CARDIOVASCULAR A:  S/P Chest Compressions P:  SDU monitoring Monitor QTc with PSY agents   RENAL A:   Hypokalemia P:   Trend BMP / UOP  Replace electrolytes as indicated   GASTROINTESTINAL A:   No Acute Issues  P:   Aspiration precautions  Diet as tolerated  HEMATOLOGIC A:   No Acute Issues  P:  Monitor CBC   INFECTIOUS A:   R/O Aspiration  Fungal Groin Rash P:   Empiric zosyn for aspiration coverage  Trend PCT If CXR remains negative in am, consider narrowing vs d/c abx all together Clotrimazole to groin   ENDOCRINE A:   Mild Hyperglycemia   P:   Monitor glucose on BMP   NEUROLOGIC A:   Overdose - unintentional Abnormal motor movement - new 11/9, see exam Chronic Pain  Cervical Spine Fusion - 9/22 Hx Depression / Anxiety  P:   RASS goal: 0 Hold sedating medications / narcotics Valium  2.5 mg PO x1 now > per Dr. Vassie Loll Will need Neurology evaluation for abnormal motor movements Monitor neuro status  Defer antidepressants to primary svc Will need to be seen by Pain Clinic or similar at discharge   FAMILY  - Updates: Patient updated at bedside.    PCCM will sign off.  Please call back if new needs arise.  Canary BrimBrandi Leianna Barga, NP-C [facility] Pgr: [phone] or if no answer [phone] 05/30/2016, 9:08 AM

## 2016-05-30 NOTE — Discharge Summary (Signed)
Physician Discharge Summary  Roger Gonzales ZOX:[id number] DOB: 1971/03/30 DOA: 05/29/2016  PCP: Alysia Penna, MD  Admit date: 05/29/2016 Discharge date: 05/30/2016  Admitted From:Home Disposition:  Home  Recommendations for Outpatient Follow-up:  1. Follow up with PCP in 1-2 weeks 2. Please obtain BMP/CBC in one week   Home Health: No Equipment/Devices: No Discharge Condition: Stable CODE STATUS: Full code Diet recommendation: Heart healthy  Brief/Interim Summary: 45 year old male with history of cervical degenerative disc disease status post cervical fusion surgery in September 2017 presented after patient was found down by his wife. Reportedly patient took extra pills of Benadryl, pain medication including hydrocodone and about a bottle of hard liquor the night prior. Patient was found by his wife unresponsive therefore she provided few CPR compression. In the ED patient was treated with Narcan. Patient was also found to have acute hypoxic respiratory failure likely related with opioids. He had acute respiratory acidosis. He required nonrebreather oxygen supplement and BiPAP. Patient clinically improved. He was also treated for possible aspiration pneumonia with IV Zosyn. Evaluated by critical care team.  Today morning. Patient denied any shortness of breath cough or chest pain. Oxygen saturation acceptable in room air. Repeat x-ray reviewed consistent with possible pneumonia versus atelectasis of right lower lobe. Patient will be discharged with 5 days of oral antibiotics with outpatient follow-up.  Neurology consult was called for the evaluation of muscle spasm. Patient reported he has periods of severe muscle spasm when he comes to ER. He stated, he gets some injection in the ER and goes home. Neurologist ordered a dose of Ativan and Reglan. Patient clinically improved. I added Robaxin for the management of muscle relaxant. Education provided to the patient at bedside regarding tapering  and stopping narcotics. He verbalized understanding. Now he is willing to try nonnarcotic pain medication including muscle relaxant NSAIDs or Tylenol. He said he will follow-up with his PCP, neurologist and neurosurgery. Patient also reported that he follows up at pain clinic. I advised him to continue to maintain his outpatient follow-up.  The patient's wife at bedside.  Discharge Diagnoses:  Active Problems:   Respiratory acidosis    Discharge Instructions  Discharge Instructions    Call MD for:  difficulty breathing, headache or visual disturbances    Complete by:  As directed    Call MD for:  hives    Complete by:  As directed    Call MD for:  persistant dizziness or light-headedness    Complete by:  As directed    Call MD for:  persistant nausea and vomiting    Complete by:  As directed    Call MD for:  severe uncontrolled pain    Complete by:  As directed    Call MD for:  temperature >100.4    Complete by:  As directed    Diet - low sodium heart healthy    Complete by:  As directed    Discharge instructions    Complete by:  As directed    Please follow up with your primary care doctor and also follow-up with the neurologist outpatient. Please check PA and lateral chest X-ray in 3-4 weeks following trial of antibiotic therapy to ensure resolution and exclude underlying malignancy.   Increase activity slowly    Complete by:  As directed        Medication List    STOP taking these medications   HYDROmorphone 2 MG tablet Commonly known as:  DILAUDID   naproxen 500 MG tablet Commonly known  as:  NAPROSYN   oxyCODONE-acetaminophen 5-325 MG tablet Commonly known as:  PERCOCET/ROXICET     TAKE these medications   amoxicillin-clavulanate 875-125 MG tablet Commonly known as:  AUGMENTIN Take 1 tablet by mouth 2 (two) times daily.   cyclobenzaprine 10 MG tablet Commonly known as:  FLEXERIL Take 1 tablet (10 mg total) by mouth 2 (two) times daily as needed for muscle  spasms.   diphenhydrAMINE 25 mg capsule Commonly known as:  BENADRYL Take 25 mg by mouth every 6 (six) hours as needed for itching.   eszopiclone 2 MG Tabs tablet Commonly known as:  LUNESTA Take 2 mg by mouth at bedtime as needed for sleep.   HYDROcodone-acetaminophen 5-325 MG tablet Commonly known as:  NORCO/VICODIN Take 1 tablet by mouth every 6 (six) hours as needed for moderate pain.   methocarbamol 500 MG tablet Commonly known as:  ROBAXIN Take 1 tablet (500 mg total) by mouth every 8 (eight) hours as needed for muscle spasms.   venlafaxine XR 75 MG 24 hr capsule Commonly known as:  EFFEXOR-XR Take 75 mg by mouth daily.      Follow-up Information    HOLWERDA, Lorin Picket, MD. Schedule an appointment as soon as possible for a visit in 1 week(s).   Specialty:  Internal Medicine Contact information: [address] [place] [address] [phone]          No Known Allergies  Consultations: Critical care and neurologist  Procedures/Studies: None  Subjective: Patient was seen and examined at bedside. He was seen twice today. Later, patient reported feeling much better. His is spasms significantly improved. Denied fever, chills, headache, dizziness, nausea, vomiting, chest pain, shortness of breath or abdominal pain. I reviewed the discharge plan with the patient at bedside. He verbalized understanding of follow-up instructions. Patient's wife at bedside. Patient denied suicidal or homicidal ideation. He denied depression.   Discharge Exam: Vitals:   05/30/16 1200 05/30/16 1400  BP:  (!) 155/97  Pulse:  91  Resp:  14  Temp: 98.3 F (36.8 C)    Vitals:   05/30/16 1000 05/30/16 1100 05/30/16 1200 05/30/16 1400  BP: 133/81   (!) 155/97  Pulse: 78 90  91  Resp: 18 (!) 22  14  Temp:   98.3 F (36.8 C)   TempSrc:   Oral   SpO2: 95% 98%  96%  Weight:      Height:        General: Pt is alert, awake, not in acute distress Cardiovascular: RRR, S1/S2 +, no  rubs, no gallops Respiratory: CTA bilaterally, no wheezing, no rhonchi Abdominal: Soft, NT, ND, bowel sounds + Extremities: no edema, no cyanosis Alert awake and following commands. He is oriented 3. Denied suicidal or homicidal ideation.   The results of significant diagnostics from this hospitalization (including imaging, microbiology, ancillary and laboratory) are listed below for reference.     Microbiology: Recent Results (from the past 240 hour(s))  Culture, blood (routine x 2)     Status: None (Preliminary result)   Collection Time: 05/29/16  8:18 AM  Result Value Ref Range Status   Specimen Description   Final    BLOOD RIGHT ANTECUBITAL Performed at [facility]    Special Requests BOTTLES DRAWN AEROBIC ONLY  Final   Culture PENDING  Incomplete   Report Status PENDING  Incomplete  MRSA PCR Screening     Status: None   Collection Time: 05/29/16  1:36 PM  Result Value Ref Range Status  MRSA by PCR NEGATIVE NEGATIVE Final    Comment:        The GeneXpert MRSA Assay (FDA approved for NASAL specimens only), is one component of a comprehensive MRSA colonization surveillance program. It is not intended to diagnose MRSA infection nor to guide or monitor treatment for MRSA infections.      Labs: BNP (last 3 results) No results for input(s): BNP in the last 8760 hours. Basic Metabolic Panel:  Recent Labs Lab 05/29/16 0430 05/30/16 0317  NA 140 138  K 3.3* 3.8  CL 102 96*  CO2 29 35*  GLUCOSE 122* 119*  BUN 14 11  CREATININE 0.90 0.97  CALCIUM 8.8* 8.6*   Liver Function Tests:  Recent Labs Lab 05/29/16 0430 05/30/16 0317  AST 38 17  ALT 43 28  ALKPHOS 84 63  BILITOT 0.4 1.0  PROT 7.7 6.3*  ALBUMIN 4.6 3.9   No results for input(s): LIPASE, AMYLASE in the last 168 hours. No results for input(s): AMMONIA in the last 168 hours. CBC:  Recent Labs Lab 05/29/16 0430 05/30/16 0317  WBC 17.9* 11.4*  NEUTROABS 15.4*  --   HGB 15.0 12.3*   HCT 42.7 36.1*  MCV 89.1 90.9  PLT 204 160   Cardiac Enzymes: No results for input(s): CKTOTAL, CKMB, CKMBINDEX, TROPONINI in the last 168 hours. BNP: Invalid input(s): POCBNP CBG:  Recent Labs Lab 05/29/16 0552  GLUCAP 230*   D-Dimer No results for input(s): DDIMER in the last 72 hours. Hgb A1c No results for input(s): HGBA1C in the last 72 hours. Lipid Profile No results for input(s): CHOL, HDL, LDLCALC, TRIG, CHOLHDL, LDLDIRECT in the last 72 hours. Thyroid function studies No results for input(s): TSH, T4TOTAL, T3FREE, THYROIDAB in the last 72 hours.  Invalid input(s): FREET3 Anemia work up No results for input(s): VITAMINB12, FOLATE, FERRITIN, TIBC, IRON, RETICCTPCT in the last 72 hours. Urinalysis    Component Value Date/Time   COLORURINE YELLOW 05/29/2016 2330   APPEARANCEUR CLEAR 05/29/2016 2330   LABSPEC 1.025 05/29/2016 2330   PHURINE 5.5 05/29/2016 2330   GLUCOSEU 100 (A) 05/29/2016 2330   HGBUR NEGATIVE 05/29/2016 2330   HGBUR negative 01/20/2011 1333   BILIRUBINUR NEGATIVE 05/29/2016 2330   KETONESUR NEGATIVE 05/29/2016 2330   PROTEINUR NEGATIVE 05/29/2016 2330   UROBILINOGEN 0.2 01/20/2011 1333   NITRITE NEGATIVE 05/29/2016 2330   LEUKOCYTESUR NEGATIVE 05/29/2016 2330   Sepsis Labs Invalid input(s): PROCALCITONIN,  WBC,  LACTICIDVEN Microbiology Recent Results (from the past 240 hour(s))  Culture, blood (routine x 2)     Status: None (Preliminary result)   Collection Time: 05/29/16  8:18 AM  Result Value Ref Range Status   Specimen Description   Final    BLOOD RIGHT ANTECUBITAL [facility]formed [facility]    Special Requests BOTTLES DRAWN AEROBIC ONLY 5ML  Final   Culture PENDING  Incomplete   Report Status PENDING  Incomplete  MRSA PCR Screening     Status: None   Collection Time: 05/29/16  1:36 PM  Result Value Ref Range Status   MRSA by PCR NEGATIVE NEGATIVE Final    Comment:        The GeneXpert MRSA Assay (FDA approved for  NASAL specimens only), is one component of a comprehensive MRSA colonization surveillance program. It is not intended to diagnose MRSA infection nor to guide or monitor treatment for MRSA infections.      Time coordinating discharge: Over 30 minutes  SIGNED:   Maxie Barbron Prasad Bhandari, MD  [facility] 05/30/2016, 3:20 PM  If 7PM-7AM, please contact night-coverage [email or web address] Password [email or web address]

## 2016-05-30 NOTE — Progress Notes (Signed)
Per MD pt able to travel alone for CXR.

## 2016-06-03 LAB — CULTURE, BLOOD (ROUTINE X 2)
CULTURE: NO GROWTH
Culture: NO GROWTH

## 2016-08-02 ENCOUNTER — Ambulatory Visit (INDEPENDENT_AMBULATORY_CARE_PROVIDER_SITE_OTHER): Payer: BLUE CROSS/BLUE SHIELD | Admitting: Neurology

## 2016-08-02 ENCOUNTER — Encounter: Payer: Self-pay | Admitting: Neurology

## 2016-08-02 VITALS — BP 125/92 | HR 86 | Ht 73.0 in | Wt 191.2 lb

## 2016-08-02 DIAGNOSIS — M62838 Other muscle spasm: Secondary | ICD-10-CM

## 2016-08-02 MED ORDER — DIAZEPAM 5 MG PO TABS: 5.0000 mg | ORAL_TABLET | Freq: Four times a day (QID) | ORAL | 1 refills | Status: DC | PRN

## 2016-08-02 NOTE — Progress Notes (Signed)
[facility]    Provider:  Dr Lucia Gaskins Referring Provider: Marikay Alar MD Primary Care Physician:  Alysia Penna, MD  CC:  Muscle spasms  HPI:  Roger Gonzales is a 46 y.o. male here as a referral from Dr. Link Snuffer for muscle spasms. He had surgery with Dr. Yetta Barre in September and it went extremely well. Spasm happened 18 months ago, numbness in the hands and neck pain, he tried therapy and steroid injections. One night he started having a involuntary head movement a jerking movement and it became worse all evening, muscles in the posterior neck and then the muscle spasm would radiate into the chest from the neck, he would have difficulty breathing because of the muscle spasms in the back of the next and in the chest and tightness, the spasms would affect his speech and he could not talk. Whole body would spasm and then release, nor rhythmic, uncontrollable, no alteration of consciousness.  Ativan and Valium helped. It will last all day until it intervenes. The spasms will happen 8-10x a minute. Almost like a hiccup. He has them every 2-3 months. No triggers. Symmetric bilaterally. No other focal neurologic deficits, associated symptoms, inciting events or modifiable factors.  Reviewed notes, labs and imaging from outside physicians, which showed:  MRI cervical spine (personally reviewed images and agree with the following):  Cervicomedullary junction is within normal limits. Spinal cord signal is within normal limits at all visualized levels.  Negative paraspinal soft tissues.  C2-C3:  Stable and negative.  C3-C4: Stable mild disc bulge and uncovertebral hypertrophy. No spinal stenosis. Mild C4 foraminal stenosis is stable.  C4-C5: Mild disc bulge. Small central disc protrusion appears stable to mildly increased. Borderline to mild spinal stenosis with no spinal cord mass effect. Mild uncovertebral hypertrophy. Mild to moderate C5 foraminal stenosis appears  increased.  C5-C6: Mild circumferential disc bulge and uncovertebral hypertrophy. No spinal stenosis. Minimal to mild left and moderate right C6 foraminal stenosis are stable to mildly increased.  C6-C7: Mild disc bulge. No spinal stenosis. Minimal to mild C7 foraminal stenosis is unchanged.  C7-T1:  Stable mild facet hypertrophy.  No significant stenosis.  No upper thoracic spinal stenosis.  IMPRESSION: 1. Mild disc bulging and/or small central disc protrusions from C3-C4 to C6-C7. That at C4-C5 appears mildly progressed since 2014, with borderline to mild spinal stenosis and mild to moderate C5 foraminal stenosis. No spinal cord mass effect or signal abnormality. 2. Stable to mildly increased mild to moderate C6 foraminal stenosis greater on the right. 3. Other cervical spine levels appear stable  CBC and CMP 05/2016 showed wbc 11.4, mild anema hgb 12.3, cl 96, co2 35 otherwise unremarkable with nml bun/Cr  Reviewed notes from admission: Neurology consult was called for the evaluation of muscle spasm. Patient reported he had periods of severe muscle spasm when he comes to ER. He stated, he gets some injection in the ER and goes home. Neurologist ordered a dose of Ativan and Reglan which helped. Patient clinically improved. Robaxin was also added for the management of muscle relaxant. Education provided to the patient at bedside regarding tapering and stopping narcotics. He verbalized understanding. He was willing to try nonnarcotic pain medication including muscle relaxant NSAIDs or Tylenol. He said he will follow-up with his PCP, neurologist and neurosurgery. Patient also reported that he follows up at pain clinic.   Review of Systems: Patient complains of symptoms per HPI as well as the following symptoms: cramps, aching muscles. Pertinent negatives per HPI. All  others negative.   Social History   Social History  . Marital status: Married    Spouse name: N/A  . Number of  children: 0  . Years of education: College   Occupational History  . [employer]    Social History Main Topics  . Smoking status: Current Some Day Smoker  . Smokeless tobacco: Current User    Types: Chew    Last attempt to quit: 01/09/2014     Comment: Smokeless tobacco  . Alcohol use Yes     Comment: Socially  . Drug use: No  . Sexual activity: Yes   Other Topics Concern  . Not on file   Social History Narrative   Lives with spouse   Caffeine use: 1 beverage/day    Family History  Problem Relation Age of Onset  . High Cholesterol Father   . Neuromuscular disorder Neg Hx     Past Medical History:  Diagnosis Date  . DDD (degenerative disc disease), cervical   . Epididymal cyst    RIGHT  . Pinched nerve    cervical    Past Surgical History:  Procedure Laterality Date  . CERVICAL FUSION  04/12/2016  . RECONSTRUCTION MANDIBLE  age 46   correct bite bilateral upper  . REMOVAL HARDWARE RIGHT UPPER MANDIBLE  04/ 2015  . REMOVAL LIPOMA, BACK  03/ 2014  . SPERMATOCELECTOMY Right 03/18/2014   Procedure: RIGHT SPERMATOCELECTOMY;  Surgeon: Crist FatBenjamin W Herrick, MD;  [facility] [place];  Service: Urology;  Laterality: Right;    Current Outpatient Prescriptions  Medication Sig Dispense Refill  . diphenhydrAMINE (BENADRYL) 25 mg capsule Take 25 mg by mouth every 6 (six) hours as needed for itching.    . eszopiclone (LUNESTA) 2 MG TABS tablet Take 2 mg by mouth at bedtime as needed for sleep.   2   No current facility-administered medications for this visit.     Allergies as of 08/02/2016  . (No Known Allergies)    Vitals: BP (!) 125/92 (BP Location: Right Arm, Patient Position: Sitting, Cuff Size: Normal)   Pulse 86   Ht 6\' 1"  (1.854 m)   Wt 191 lb 3.2 oz (86.7 kg)   BMI 25.23 kg/m  Last Weight:  Wt Readings from Last 1 Encounters:  08/02/16 191 lb 3.2 oz (86.7 kg)   Last Height:   Ht Readings from Last 1 Encounters:  08/02/16 6\' 1"   (1.854 m)   Physical exam: Exam: Gen: NAD, conversant, well nourised, well groomed                     CV: RRR, no MRG. No Carotid Bruits. No peripheral edema, warm, nontender Eyes: Conjunctivae clear without exudates or hemorrhage  Neuro: Detailed Neurologic Exam  Speech:    Speech is normal; fluent and spontaneous with normal comprehension.  Cognition:    The patient is oriented to person, place, and time;     recent and remote memory intact;     language fluent;     normal attention, concentration,     fund of knowledge Cranial Nerves:    The pupils are equal, round, and reactive to light. The fundi are normal and spontaneous venous pulsations are present. Visual fields are full to finger confrontation. Extraocular movements are intact. Trigeminal sensation is intact and the muscles of mastication are normal. The face is symmetric. The palate elevates in the midline. Hearing intact. Voice is normal. Shoulder shrug is normal. The tongue has normal  motion without fasciculations.   Coordination:    Normal finger to nose and heel to shin. Normal rapid alternating movements.   Gait:    Heel-toe and tandem gait are normal.   Motor Observation:    No asymmetry, no atrophy, and no involuntary movements noted. Tone:    Normal muscle tone.    Posture:    Posture is normal. normal erect    Strength:    Strength is V/V in the upper and lower limbs.      Sensation: intact to LT     Reflex Exam:  DTR's:    Deep tendon reflexes in the upper and lower extremities are normal bilaterally.   Toes:    The toes are downgoing bilaterally.   Clonus:    Clonus is absent.    Assessment/Plan:  Patient with episodic bilateral neck and chest spasms history of cervical degenerative disc disease status post cervical fusion surgery in September 2017 and jerking without alteration of awareness necessitating ED visit and benzodiazepines.Can last hours. Uncertain etiology. Asked him to videotape  the movements next time it happens and get the video to me. In the meantime with give him diazepam for acute management but still recommend proceeding to the ED if diazepam does not stop the movements in a reasonable time or he experiences any significant symptoms such as difficulty breathing. Will recheck a bmp and mag. I had a long discussion with patient NOT to take diazepam with other sedating medications, alcohol or narcotics and that doing so could compromise his respiratory status, his mental status and cause significant morbidity or death; he is not to take more than prescribed or fill early and he is only to take it for his symptoms of muscle spasms. Discussed side effects as per patient instructions.   Cc: Dr. Yetta Barre and Clista Bernhardt, MD  [facility] [address] [place], [place] [address]  Phone [phone] Fax [phone]

## 2016-08-02 NOTE — Patient Instructions (Signed)
Remember to drink plenty of fluid, eat healthy meals and do not skip any meals. Try to eat protein with a every meal and eat a healthy snack such as fruit or nuts in between meals. Try to keep a regular sleep-wake schedule and try to exercise daily, particularly in the form of walking, 20-30 minutes a day, if you can.   As far as your medications are concerned, I would like to suggest: Valium as needed  Paroxysmal dystonia  I would like to see you back after video, sooner if we need to. Please call us with any interim questions, concerns, problems, updates or refill requests.   Our phone number is [phone]. We also have an after hours call service for urgent matters and there is a physician on-call for urgent questions. For any emergencies you know to call 911 or go to the nearest emergency room  Diazepam tablets What is this medicine? DIAZEPAM (dye AZ e pam) is a benzodiazepine. It is used to treat anxiety and nervousness. It also can help treat alcohol withdrawal, relax muscles, and treat certain types of seizures. This medicine may be used for other purposes; ask your health care provider or pharmacist if you have questions. COMMON BRAND NAME(S): Valium What should I tell my health care provider before I take this medicine? They need to know if you have any of these conditions -an alcohol or drug abuse problem -bipolar disorder, depression, psychosis or other mental health condition -glaucoma -kidney or liver disease -lung or breathing disease -myasthenia gravis -Parkinson's disease -seizures or a history of seizures -suicidal thoughts -an unusual or allergic reaction to diazepam, other benzodiazepines, foods, dyes, or preservatives -pregnant or trying to get pregnant -breast-feeding How should I use this medicine? Take this medicine by mouth with a glass of water. Follow the directions on the prescription label. If this medicine upsets your stomach, take it with food or milk.  Take your doses at regular intervals. Do not take your medicine more often than directed. If you have been taking this medicine regularly for some time, do not suddenly stop taking it. You must gradually reduce the dose or you may get severe side effects. Ask your doctor or health care professional for advice. Even after you stop taking this medicine it can still affect your body for several days. A special MedGuide will be given to you by the pharmacist with each prescription and refill. Be sure to read this information carefully each time. Talk to your pediatrician regarding the use of this medicine in children. Special care may be needed. Overdosage: If you think you have taken too much of this medicine contact a poison control center or emergency room at once. NOTE: This medicine is only for you. Do not share this medicine with others. What if I miss a dose? If you miss a dose, take it as soon as you can. If it is almost time for your next dose, take only that dose. Do not take double or extra doses. What may interact with this medicine? Do not take this medicine with any of the following medications: -narcotic medicines for cough -sodium oxybate This medicine may also interact with the following medications: -alcohol -antacids -antihistamines for allergy, cough and cold -certain antibiotics like clarithromycin, erythromycin, rifampin -certain medicines for anxiety or sleep -certain medicines for blood pressure, heart disease, irregular heartbeat -certain medicines for depression, like amitriptyline, fluoxetine, sertraline -certain medicines for fungal infections like ketoconazole, itraconazole, clotrimazole -certain medicines for psychotic disturbances -certain medicines for  seizures like carbamazepine, phenobarbital, phenytoin, primidone, valproate -cimetidine -cyclosporine -dexamethasone -general anesthetics like lidocaine, pramoxine, tetracaine -MAOIs like Carbex, Eldepryl, Marplan,  Nardil, and Parnate -medicines that relax muscles for surgery -narcotic medicines for pain -omeprazole -paclitaxel -phenothiazines like chlorpromazine, mesoridazine, prochlorperazine, thioridazine -theophyline -warfarin This list may not describe all possible interactions. Give your health care provider a list of all the medicines, herbs, non-prescription drugs, or dietary supplements you use. Also tell them if you smoke, drink alcohol, or use illegal drugs. Some items may interact with your medicine. What should I watch for while using this medicine? Tell your doctor or health care professional if your symptoms do not start to get better or if they get worse. Do not stop taking except on your doctor's advice. You may develop a severe reaction. Your doctor will tell you how much medicine to take. You may get drowsy or dizzy. Do not drive, use machinery, or do anything that needs mental alertness until you know how this medicine affects you. To reduce the risk of dizzy and fainting spells, do not stand or sit up quickly, especially if you are an older patient. Alcohol may increase dizziness and drowsiness. Avoid alcoholic drinks. If you are taking another medicine that also causes drowsiness, you may have more side effects. Give your health care provider a list of all medicines you use. Your doctor will tell you how much medicine to take. Do not take more medicine than directed. Call emergency for help if you have problems breathing or unusual sleepiness. What side effects may I notice from receiving this medicine? Side effects that you should report to your doctor or health care professional as soon as possible: -allergic reactions like skin rash, itching or hives, swelling of the face, lips, or tongue -breathing problems -confusion -loss of balance or coordination -signs and symptoms of low blood pressure like dizziness; feeling faint or lightheaded, falls; unusually weak or tired -suicidal  thoughts or other mood changes -trouble passing urine or change in the amount of urine Side effects that usually do not require medical attention (report to your doctor or health care professional if they continue or are bothersome): -dizziness -headache -nausea, vomiting -tiredness This list may not describe all possible side effects. Call your doctor for medical advice about side effects. You may report side effects to FDA at 1-800-FDA-1088. Where should I keep my medicine? Keep out of the reach of children. This medicine can be abused. Keep your medicine in a safe place to protect it from theft. Do not share this medicine with anyone. Selling or giving away this medicine is dangerous and against the law. This medicine may cause accidental overdose and death if taken by other adults, children, or pets. Mix any unused medicine with a substance like cat litter or coffee grounds. Then throw the medicine away in a sealed container like a sealed bag or a coffee can with a lid. Do not use the medicine after the expiration date. Store at room temperature between 15 and 30 degrees C (59 and 86 degrees F). Protect from light. Keep container tightly closed. NOTE: This sheet is a summary. It may not cover all possible information. If you have questions about this medicine, talk to your doctor, pharmacist, or health care provider.  2017 Elsevier/Gold Standard (2015-04-07 10:52:36)

## 2016-08-03 ENCOUNTER — Encounter: Payer: Self-pay | Admitting: Neurology

## 2016-12-29 ENCOUNTER — Ambulatory Visit (HOSPITAL_COMMUNITY)
Admission: EM | Admit: 2016-12-29 | Discharge: 2016-12-29 | Disposition: A | Discharge status: Home / Self Care | Payer: BLUE CROSS/BLUE SHIELD | Attending: Internal Medicine | Admitting: Internal Medicine

## 2016-12-29 ENCOUNTER — Encounter (HOSPITAL_COMMUNITY): Payer: Self-pay | Admitting: Emergency Medicine

## 2016-12-29 DIAGNOSIS — M62838 Other muscle spasm: Secondary | ICD-10-CM | POA: Diagnosis not present

## 2016-12-29 MED ORDER — DIAZEPAM 5 MG PO TABS: 5.0000 mg | ORAL_TABLET | Freq: Four times a day (QID) | ORAL | 0 refills | Status: DC | PRN

## 2016-12-29 NOTE — ED Triage Notes (Signed)
[facility] with a complaint of muscle spasms in his neck and back. The patient stated that this has been recurring since a spinal fusion on 04/12/2016.

## 2016-12-29 NOTE — ED Provider Notes (Signed)
CSN: [id number]     Arrival date & time 12/29/16  1939 History   First MD Initiated Contact with Patient 12/29/16 2022     Chief Complaint  Patient presents with  . Spasms   (Consider location/radiation/quality/duration/timing/severity/associated sxs/prior Treatment) 46 year old male presents to clinic for chief complaint of muscle spasms ongoing for the last 24 hours. States that these started in September 2017 following a spinal fusion, and that he was seen by a neurologist in January, given a short course of Valium to stop them, and he has not had any further episodes of spasticity in the last 6 months until today. No constitutional symptoms such as fever, chills, nausea, vomiting, diarrhea, or other systemic complaints.   The history is provided by the patient.    Past Medical History:  Diagnosis Date  . DDD (degenerative disc disease), cervical   . Epididymal cyst    RIGHT  . Pinched nerve    cervical   Past Surgical History:  Procedure Laterality Date  . CERVICAL FUSION  04/12/2016  . RECONSTRUCTION MANDIBLE  age 72   correct bite bilateral upper  . REMOVAL HARDWARE RIGHT UPPER MANDIBLE  04/ 2015  . REMOVAL LIPOMA, BACK  03/ 2014  . SPERMATOCELECTOMY Right 03/18/2014   Procedure: RIGHT SPERMATOCELECTOMY;  Surgeon: Crist Fat, MD;  Location: [facility];  Service: Urology;  Laterality: Right;   Family History  Problem Relation Age of Onset  . High Cholesterol Father   . Neuromuscular disorder Neg Hx    Social History  Substance Use Topics  . Smoking status: Current Some Day Smoker  . Smokeless tobacco: Current User    Types: Chew    Last attempt to quit: 01/09/2014     Comment: Smokeless tobacco  . Alcohol use Yes     Comment: Socially    Review of Systems  Constitutional: Negative.   HENT: Negative.   Respiratory: Negative.   Cardiovascular: Negative.   Gastrointestinal: Negative.   Musculoskeletal:       Muscle spasms  Neurological:  Negative for light-headedness and headaches.    Allergies  Patient has no known allergies.  Home Medications   Prior to Admission medications   Medication Sig Start Date End Date Taking? Authorizing Provider  eszopiclone (LUNESTA) 2 MG TABS tablet Take 2 mg by mouth at bedtime as needed for sleep.  01/13/16  Yes [provider]  diazepam (VALIUM) 5 MG tablet Take 1 tablet (5 mg total) by mouth every 6 (six) hours as needed for anxiety. 12/29/16   Dorena Bodo, NP   Meds Ordered and Administered this Visit  Medications - No data to display  BP 124/74 (BP Location: Left Arm)   Pulse 72   Temp 98 F (36.7 C) (Oral)   Resp 16   SpO2 100%  No data found.   Physical Exam  Constitutional: He is oriented to person, place, and time. He appears well-developed and well-nourished. No distress.  Well-developed, well-nourished male appropriately dressed for weather, notable spasticitic, movements seen throughout the exam, and interview.  HENT:  Head: Normocephalic.  Right Ear: External ear normal.  Left Ear: External ear normal.  Eyes: Conjunctivae are normal.  Neck: Normal range of motion.  Cardiovascular: Normal rate and regular rhythm.   Pulmonary/Chest: Effort normal and breath sounds normal.  Neurological: He is alert and oriented to person, place, and time.  Skin: Skin is warm and dry. Capillary refill takes less than 2 seconds. He is not diaphoretic.  Psychiatric:  He has a normal mood and affect. His behavior is normal.  Nursing note and vitals reviewed.   Urgent Care Course     Procedures (including critical care time)  Labs Review Labs Reviewed - No data to display  Imaging Review No results found.    MDM   1. Muscle spasm    Prior medical records examined, and neurology notes were read, patient given a short course of Valium, consistent with prior treatments and encouraged to contact his neurologist in the morning.  [place] controlled  substances reporting system consulted prior to issuing prescription.     Dorena BodoKennard, Azia Toutant, NP 12/29/16 2114

## 2016-12-29 NOTE — Discharge Instructions (Signed)
I provided a short course of diazepam for your muscle spasms consistent with what is previously been done by your neurologist. Contact your neurologist in the morning, to schedule an appointment for follow-up care and evaluation. Do not take this medicine with your Lunesta, as this will cause excessive sedation, and may cause loss of your respiratory drive. Do not drink any alcohol while taking either.

## 2017-01-07 ENCOUNTER — Telehealth: Payer: Self-pay | Admitting: Neurology

## 2017-01-07 NOTE — Telephone Encounter (Signed)
Pt has not had spasms for the past few months. June 10th he had another muscle spasms in the chest, [facility]. He was given valium and it subsided the next day. 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 Pt has appt with Eber Jonesarolyn tomorrow at 2:15

## 2017-01-07 NOTE — Telephone Encounter (Signed)
Noted.  Dr. Lucia Gaskinsahern saw for muscle spasms radiating to chest.

## 2017-01-08 ENCOUNTER — Encounter: Payer: Self-pay | Admitting: Nurse Practitioner

## 2017-01-08 ENCOUNTER — Ambulatory Visit (INDEPENDENT_AMBULATORY_CARE_PROVIDER_SITE_OTHER): Payer: BLUE CROSS/BLUE SHIELD | Admitting: Nurse Practitioner

## 2017-01-08 DIAGNOSIS — M62838 Other muscle spasm: Secondary | ICD-10-CM | POA: Diagnosis not present

## 2017-01-08 MED ORDER — DIAZEPAM 5 MG PO TABS: 5.0000 mg | ORAL_TABLET | Freq: Four times a day (QID) | ORAL | 1 refills | Status: DC | PRN

## 2017-01-08 NOTE — Patient Instructions (Addendum)
Will order Valium 5mg  to take prnRX to pt Labs today F/U as necessary

## 2017-01-08 NOTE — Progress Notes (Signed)
[facility]  PATIENT: Roger Gonzales DOB: 1971/07/07   REASON FOR VISIT: follow up for neck and chest spasms HISTORY FROM: patient    HISTORY OF PRESENT ILLNESS:UPDATE 06/20/2018[name], 46 year old male returns for follow-up after experiencing another severe muscle spasm to his neck and chest on 12/29/2016. He went to urgent care and was given Valium which he took for 3 days and it relieves his symptoms. This muscle spasm was slight similar muscle spasms that he has had in the past where his neck will start spasm and then he will have chest tightness and because of this he will have difficulty speaking. His whole body spasm and then release it is uncontrollable. It happens every 4-5 months. He has no other focal neurologic deficits associated symptoms triggering events etc. he returns for reevaluation. 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    HISTORY1/12/18 AA Roger Gonzales is a 46 y.o. male here as a referral from Dr. Link Snuffer for muscle spasms. He had surgery with Dr. Yetta Barre in September and it went extremely well. Spasm happened 18 months ago, numbness in the hands and neck pain, he tried therapy and steroid injections. One night he started having a involuntary head movement a jerking movement and it became worse all evening, muscles in the posterior neck and then the muscle spasm would radiate into the chest from the neck, he would have difficulty breathing because of the muscle spasms in the back of the next and in the chest and tightness, the spasms would affect his speech and he could not talk. Whole body would spasm and then release, nor rhythmic, uncontrollable, no alteration of consciousness.  Ativan and Valium helped. It will last all day until it intervenes. The spasms will happen 8-10x a minute. Almost like a hiccup. He has them every 2-3 months. No triggers. Symmetric bilaterally. No other focal neurologic deficits, associated  symptoms, inciting events or modifiable factors.    REVIEW OF SYSTEMS: Full 14 system review of systems performed and notable only for those listed, all others are neg:  Constitutional: neg  Cardiovascular: neg Ear/Nose/Throat: neg  Skin: neg Eyes: neg Respiratory: neg Gastroitestinal: neg  Hematology/Lymphatic: neg  Endocrine: neg Musculoskeletal Neck pain and muscle spasms  Allergy/Immunology:Environmental allergies Psychiatric: neg Sleep : neg   ALLERGIES: No Known Allergies  HOME MEDICATIONS: Outpatient Medications Prior to Visit  Medication Sig Dispense Refill  . diazepam (VALIUM) 5 MG tablet Take 1 tablet (5 mg total) by mouth every 6 (six) hours as needed for anxiety. 20 tablet 0  . eszopiclone (LUNESTA) 2 MG TABS tablet Take 2 mg by mouth at bedtime as needed for sleep.   2   No facility-administered medications prior to visit.     PAST MEDICAL HISTORY: Past Medical History:  Diagnosis Date  . DDD (degenerative disc disease), cervical   . Epididymal cyst    RIGHT  . Pinched nerve    cervical    PAST SURGICAL HISTORY: Past Surgical History:  Procedure Laterality Date  . CERVICAL FUSION  04/12/2016  . RECONSTRUCTION MANDIBLE  age 19   correct bite bilateral upper  . REMOVAL HARDWARE RIGHT UPPER MANDIBLE  04/ 2015  . REMOVAL LIPOMA, BACK  03/ 2014  . SPERMATOCELECTOMY Right 03/18/2014   Procedure: RIGHT SPERMATOCELECTOMY;  Surgeon: Crist Fat, MD;  [facility];  Service: Urology;  Laterality: Right;    FAMILY HISTORY: Family History  Problem Relation Age of  Onset  . High Cholesterol Father   . Neuromuscular disorder Neg Hx     SOCIAL HISTORY: Social History   Social History  . Marital status: Married    Spouse name: N/A  . Number of children: 0  . Years of education: College   Occupational History  . [employer]    Social History Main Topics  . Smoking status: Never Smoker  . Smokeless tobacco:  Former Neurosurgeon    Types: Chew    Quit date: 01/09/2014     Comment: Smokeless tobacco  . Alcohol use Yes     Comment: Socially  . Drug use: No  . Sexual activity: Yes   Other Topics Concern  . Not on file   Social History Narrative   Lives with spouse   Caffeine use: 1 beverage/day     PHYSICAL EXAM  Vitals:   01/08/17 1352  BP: 130/87  Pulse: 77  Weight: 186 lb (84.4 kg)  Height: 6\' 1"  (1.854 m)   Body mass index is 24.54 kg/m.  Generalized: Well developed, in no acute distress  Head: normocephalic and atraumatic,. Oropharynx benign  Neck: Supple,   Cardiac: Regular rate rhythm, no murmur  Musculoskeletal: No deformity   Neurological examination   Mentation: Alert oriented to time, place, history taking. Attention span and concentration appropriate. Recent and remote memory intact.  Follows all commands speech and language fluent.   Cranial nerve II-XII: .Pupils were equal round reactive to light extraocular movements were full, visual field were full on confrontational test. Facial sensation and strength were normal. hearing was intact to finger rubbing bilaterally. Uvula tongue midline. head turning and shoulder shrug were normal and symmetric.Tongue protrusion into cheek strength was normal. Motor: normal bulk and tone, full strength in the BUE, BLE, fine finger movements normal, no pronator drift. No focal weakness Sensory: normal and symmetric to light touch, pinprick, and  Vibration,in the upper and lower extremities  Coordination: finger-nose-finger, heel-to-shin bilaterally, no dysmetria Reflexes: Brachioradialis 2/2, biceps 2/2, triceps 2/2, patellar 2/2, Achilles 2/2, plantar responses were flexor bilaterally. Gait and Station: Rising up from seated position without assistance, normal stance,  moderate stride, good arm swing, smooth turning, able to perform tiptoe, and heel walking without difficulty. Tandem gait is steady  DIAGNOSTIC DATA (LABS, IMAGING,  TESTING) - I reviewed patient records, labs, notes, testing and imaging myself where available.  Lab Results  Component Value Date   WBC 11.4 (H) 05/30/2016   HGB 12.3 (L) 05/30/2016   HCT 36.1 (L) 05/30/2016   MCV 90.9 05/30/2016   PLT 160 05/30/2016      Component Value Date/Time   NA 138 05/30/2016 0317   K 3.8 05/30/2016 0317   CL 96 (L) 05/30/2016 0317   CO2 35 (H) 05/30/2016 0317   GLUCOSE 119 (H) 05/30/2016 0317   BUN 11 05/30/2016 0317   CREATININE 0.97 05/30/2016 0317   CALCIUM 8.6 (L) 05/30/2016 0317   PROT 6.3 (L) 05/30/2016 0317   ALBUMIN 3.9 05/30/2016 0317   AST 17 05/30/2016 0317   ALT 28 05/30/2016 0317   ALKPHOS 63 05/30/2016 0317   BILITOT 1.0 05/30/2016 0317   GFRNONAA >60 05/30/2016 0317   GFRAA >60 05/30/2016 0317    ASSESSMENT AND PLAN Patient with episodic bilateral neck and chest spasms history of cervical degenerative disc disease status post cervical fusion surgery in September 2017 and jerking without alteration of awareness necessitating ED visit and benzodiazepines.Can last hours. Uncertain etiology.  Discussed with Dr. Lucia Gaskins  Will order Valium 5mg  to take prn RX to pt Labs today, CMP and CK and magnesium  Need to video episode F/U as necessary I spent 25 minutes in total face to face time with the patient more than 50% of which was spent counseling and coordination of care, reviewing test results reviewing medications and discussing and reviewing the diagnosis of neck and chest spasms and further treatment options. , Cline CrockNancy Carolyn Abednego Yeates, GNP, [facility], APRN  [facility] [address], [address] [place], [place] [address] [phone]

## 2017-01-09 ENCOUNTER — Telehealth: Payer: Self-pay | Admitting: *Deleted

## 2017-01-09 LAB — COMPREHENSIVE METABOLIC PANEL
ALT: 14 IU/L (ref 0–44)
AST: 12 IU/L (ref 0–40)
Albumin/Globulin Ratio: 2.3 — ABNORMAL HIGH (ref 1.2–2.2)
Albumin: 4.8 g/dL (ref 3.5–5.5)
Alkaline Phosphatase: 99 IU/L (ref 39–117)
BUN/Creatinine Ratio: 11 (ref 9–20)
BUN: 11 mg/dL (ref 6–24)
Bilirubin Total: 0.6 mg/dL (ref 0.0–1.2)
CALCIUM: 9.7 mg/dL (ref 8.7–10.2)
CHLORIDE: 99 mmol/L (ref 96–106)
CO2: 29 mmol/L (ref 20–29)
Creatinine, Ser: 0.98 mg/dL (ref 0.76–1.27)
GFR, EST AFRICAN AMERICAN: 107 mL/min/{1.73_m2} (ref >59)
GFR, EST NON AFRICAN AMERICAN: 93 mL/min/{1.73_m2} (ref >59)
GLUCOSE: 98 mg/dL (ref 65–99)
Globulin, Total: 2.1 g/dL (ref 1.5–4.5)
Potassium: 4.7 mmol/L (ref 3.5–5.2)
Sodium: 140 mmol/L (ref 134–144)
TOTAL PROTEIN: 6.9 g/dL (ref 6.0–8.5)

## 2017-01-09 LAB — MAGNESIUM: MAGNESIUM: 2.1 mg/dL (ref 1.6–2.3)

## 2017-01-09 LAB — CK: Total CK: 64 U/L (ref 24–204)

## 2017-01-09 NOTE — Telephone Encounter (Signed)
LMVM (mobile ok per DPR) for pt that labs ok per CM/NP.  Can call back if needed.

## 2017-01-09 NOTE — Telephone Encounter (Signed)
-----   Message from Nilda RiggsNancy Carolyn Martin, NP sent at 01/09/2017  7:09 AM EDT ----- Labs ok please call the patient

## 2017-01-15 ENCOUNTER — Telehealth: Payer: Self-pay | Admitting: Nurse Practitioner

## 2017-01-15 DIAGNOSIS — M62838 Other muscle spasm: Secondary | ICD-10-CM

## 2017-01-15 NOTE — Telephone Encounter (Signed)
Patient called office in reference to requesting a note/letter stating that he is okay to return to work.  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  Please call

## 2017-01-15 NOTE — Telephone Encounter (Signed)
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 Dr Lucia GaskinsAhern told me that day she did not feel comfortable with that. You can check with her.

## 2017-01-16 NOTE — Telephone Encounter (Signed)
Called  patient and made  him aware that Dr. 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 He has episodic neck and chest spasms that are uncontrollable when they occur. He is agreeable for a second opinion at [facility] Dr. Boris SharperFrancis Walker

## 2017-01-16 NOTE — Telephone Encounter (Signed)
Roger JonesCarolyn you will have to discuss with him as I am leaving right after work today. 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 I think he needs a second opinion from a movement disorder specialist. What if we refer him to see movement disorders at [facility]? thanks

## 2017-01-16 NOTE — Addendum Note (Signed)
Addended by: Guy BeginYOUNG, SANDRA S on: 01/16/2017 04:52 PM   Modules accepted: Orders

## 2017-01-16 NOTE — Addendum Note (Signed)
Addended by: Guy BeginYOUNG, Edrick Whitehorn S on: 01/16/2017 04:58 PM   Modules accepted: Orders

## 2017-01-16 NOTE — Telephone Encounter (Signed)
LMVM for pt that did speak to Dr. Lucia GaskinsAhern.  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 Lucia GaskinsAhern stated  If you were still having muscle spasms that required acute treatment, then she could not release you.  Are you seeing FAA MD?  Please return call.

## 2017-01-16 NOTE — Telephone Encounter (Signed)
Pt called me back.  He is seeing an [employer] MD, Dr.Charles Harle StanfordNicholson.  He saw him this am, and he approved for him to go back to work, pt stated, thought this Monday.   His job requires a letter from both Dr. Harle StanfordNicholson and neurologist.  He has had no sx since the last episode 12/29/2016.(no sx in over 2 wks). He took valium until 6/13th.   He has not taken since.  He said we can call Dr. Harle StanfordNicholson to discuss,  (he will call back if does not pick up).  [phone].  Please let him know when we decide.

## 2017-01-16 NOTE — Telephone Encounter (Signed)
He told Roger Gonzales Roger Gonzales that he was still having the muscle spasms that require acute treatment. 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 Also [facility] thanks

## 2017-01-21 NOTE — Telephone Encounter (Signed)
I spoke to pt and he asked if we do SNIFF test here?  I relayed that was not familiar with this test, providers may be.  (test phrenic nerve in diaphragm).  Irritiated during back surgery or with getting steroid injections?  It is done under fluoroscopy.  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  He still plans on seeing referral for second opinion.

## 2017-01-21 NOTE — Telephone Encounter (Signed)
Pt said it has been recommended by FAA Dr to have a SNIFF test to check his diaphragm. He is wanting to know if Dr Lucia GaskinsAhern will order this for him. Please call

## 2017-01-21 NOTE — Telephone Encounter (Signed)
Discussed with Dr. Levada DyPennumalli in Dr. Celene SquibbAherns absence. He recommends holding off SNIFF test since pt will be seen by Dr. Dan [place]Walker [place],but that would be Dr. Celene SquibbAherns decision.

## 2017-01-27 ENCOUNTER — Telehealth: Payer: Self-pay | Admitting: Neurology

## 2017-01-27 NOTE — Telephone Encounter (Signed)
Patient [facility] and was told they never received referral. Please resend.

## 2017-01-28 NOTE — Telephone Encounter (Signed)
Called and left message for Nurse Annice PihJackie or Maralyn SagoSarah to call me back because patient still has not been scheduled. I was promised a telephone call back. [phone].

## 2017-01-28 NOTE — Telephone Encounter (Signed)
Re faxed Referral  [facility]. Telephone [phone] - fax [phone].

## 2017-02-01 NOTE — Progress Notes (Signed)
Personally  participated in, made any corrections needed, and agree with history, physical, neuro exam,assessment and plan as stated.     Abby Tucholski, MD [facility]     

## 2017-02-11 NOTE — Telephone Encounter (Signed)
Called patient and relayed I have confirmations'  x2 I will be glade to send fax again. Telephone [phone]- fax [phone]. Called Dr. Tilman NeatWalker's office and they are still processing referral. I asked for Sarah RN to please call me back with an apt for patient.

## 2017-02-11 NOTE — Telephone Encounter (Signed)
Pt called back about SNIFF test. He is wanting to have it asap and not wait until he sees Dr Dan HumphreysWalker.  He said GI does this test. Please call him at [phone]

## 2017-02-11 NOTE — Telephone Encounter (Signed)
Pt called Dr Dan HumphreysWalker office last week and referral still has not been rec'd. Please call him at [phone]

## 2017-02-11 NOTE — Telephone Encounter (Signed)
Candise BowensJen, please order thanks

## 2017-02-12 NOTE — Telephone Encounter (Signed)
Orders entered

## 2017-02-12 NOTE — Addendum Note (Signed)
Addended by: Donnelly AngelicaHOGAN, Montie Swiderski L on: 02/12/2017 02:16 PM   Modules accepted: Orders

## 2017-02-17 ENCOUNTER — Other Ambulatory Visit: Payer: Self-pay | Admitting: Internal Medicine

## 2017-02-17 DIAGNOSIS — Q791 Other congenital malformations of diaphragm: Secondary | ICD-10-CM

## 2017-02-19 ENCOUNTER — Ambulatory Visit
Admission: RE | Admit: 2017-02-19 | Discharge: 2017-02-19 | Disposition: A | Discharge status: Home / Self Care | Payer: BLUE CROSS/BLUE SHIELD | Source: Ambulatory Visit | Attending: Internal Medicine | Admitting: Internal Medicine

## 2017-02-19 DIAGNOSIS — Q791 Other congenital malformations of diaphragm: Secondary | ICD-10-CM

## 2017-04-12 ENCOUNTER — Encounter (HOSPITAL_BASED_OUTPATIENT_CLINIC_OR_DEPARTMENT_OTHER): Payer: Self-pay | Admitting: *Deleted

## 2017-04-12 ENCOUNTER — Emergency Department (HOSPITAL_BASED_OUTPATIENT_CLINIC_OR_DEPARTMENT_OTHER): Payer: BLUE CROSS/BLUE SHIELD

## 2017-04-12 ENCOUNTER — Emergency Department (HOSPITAL_BASED_OUTPATIENT_CLINIC_OR_DEPARTMENT_OTHER)
Admission: EM | Admit: 2017-04-12 | Discharge: 2017-04-12 | Disposition: A | Discharge status: Home / Self Care | Payer: BLUE CROSS/BLUE SHIELD | Attending: Emergency Medicine | Admitting: Emergency Medicine

## 2017-04-12 DIAGNOSIS — Y9373 Activity, racquet and hand sports: Secondary | ICD-10-CM | POA: Diagnosis not present

## 2017-04-12 DIAGNOSIS — Z79899 Other long term (current) drug therapy: Secondary | ICD-10-CM | POA: Insufficient documentation

## 2017-04-12 DIAGNOSIS — Z87891 Personal history of nicotine dependence: Secondary | ICD-10-CM | POA: Insufficient documentation

## 2017-04-12 DIAGNOSIS — S161XXA Strain of muscle, fascia and tendon at neck level, initial encounter: Secondary | ICD-10-CM | POA: Insufficient documentation

## 2017-04-12 DIAGNOSIS — Y929 Unspecified place or not applicable: Secondary | ICD-10-CM | POA: Diagnosis not present

## 2017-04-12 DIAGNOSIS — X509XXA Other and unspecified overexertion or strenuous movements or postures, initial encounter: Secondary | ICD-10-CM | POA: Insufficient documentation

## 2017-04-12 DIAGNOSIS — Y999 Unspecified external cause status: Secondary | ICD-10-CM | POA: Insufficient documentation

## 2017-04-12 DIAGNOSIS — M542 Cervicalgia: Secondary | ICD-10-CM | POA: Diagnosis present

## 2017-04-12 MED ORDER — DEXAMETHASONE SODIUM PHOSPHATE 10 MG/ML IJ SOLN
10.0000 mg | Freq: Once | INTRAMUSCULAR | Status: AC
  Administered 2017-04-12: 10 mg via INTRAVENOUS
  Filled 2017-04-12: qty 1

## 2017-04-12 MED ORDER — DIAZEPAM 5 MG/ML IJ SOLN
5.0000 mg | Freq: Once | INTRAMUSCULAR | Status: AC
  Administered 2017-04-12: 5 mg via INTRAVENOUS
  Filled 2017-04-12: qty 2

## 2017-04-12 MED ORDER — FENTANYL CITRATE (PF) 100 MCG/2ML IJ SOLN
100.0000 ug | Freq: Once | INTRAMUSCULAR | Status: AC
  Administered 2017-04-12: 100 ug via INTRAVENOUS
  Filled 2017-04-12: qty 2

## 2017-04-12 MED ORDER — HYDROMORPHONE HCL 1 MG/ML IJ SOLN
1.0000 mg | Freq: Once | INTRAMUSCULAR | Status: AC
  Administered 2017-04-12: 1 mg via INTRAVENOUS
  Filled 2017-04-12: qty 1

## 2017-04-12 MED ORDER — IBUPROFEN 600 MG PO TABS: 600.0000 mg | ORAL_TABLET | Freq: Four times a day (QID) | ORAL | 0 refills | Status: DC | PRN

## 2017-04-12 MED ORDER — KETOROLAC TROMETHAMINE 15 MG/ML IJ SOLN
15.0000 mg | Freq: Once | INTRAMUSCULAR | Status: AC
  Administered 2017-04-12: 15 mg via INTRAVENOUS
  Filled 2017-04-12: qty 1

## 2017-04-12 MED ORDER — HYDROCODONE-ACETAMINOPHEN 5-325 MG PO TABS: 1.0000 | ORAL_TABLET | Freq: Two times a day (BID) | ORAL | 0 refills | Status: DC | PRN

## 2017-04-12 MED ORDER — METHOCARBAMOL 500 MG PO TABS: 500.0000 mg | ORAL_TABLET | Freq: Two times a day (BID) | ORAL | 0 refills | Status: DC

## 2017-04-12 NOTE — ED Triage Notes (Signed)
Pt arrives with c/o pain in the neck while playing tennis yesterday with weakness in the left arm and numbness in the left hand. Pt says he "felt and heard a pop where I imagine the surgery to have been". Pt took tramadol (last this morning) without relief.  hx of spinal fusion surgery c3-c5 last year.

## 2017-04-12 NOTE — ED Provider Notes (Signed)
[facility] Provider Note   CSN: [id number] Arrival date & time: 04/12/17  1516     History   Chief Complaint Chief Complaint  Patient presents with  . Neck Pain    HPI Roger Gonzales is a 46 y.o. male.  HPI Pt comes in with cc of neck pain. Pt has hx of cervical spine fusion. He reports that he was playing tennis yday and had sudden onset severe neck pain which radiated to his LUE. Pt had a moment where he had numbness in his arm and weakness. Pt still feels a little weak over the LUE with grip. Pt has tingling over the distal digit #3 and 4. Pt also has neck pain that is severe and worse than it was before operation. Pt has no difficulty breathing. Pain is worse with movement.  Past Medical History:  Diagnosis Date  . DDD (degenerative disc disease), cervical   . Epididymal cyst    RIGHT  . Pinched nerve    cervical    Patient Active Problem List   Diagnosis Date Noted  . Muscle spasms of neck 01/08/2017  . PNA (pneumonia)   . Respiratory acidosis 05/29/2016  . Acute respiratory failure with hypercapnia (HCC)   . Accidental drug overdose   . PAIN IN JOINT, UPPER ARM 01/20/2011    Past Surgical History:  Procedure Laterality Date  . CERVICAL FUSION  04/12/2016  . RECONSTRUCTION MANDIBLE  age 45   correct bite bilateral upper  . REMOVAL HARDWARE RIGHT UPPER MANDIBLE  04/ 2015  . REMOVAL LIPOMA, BACK  03/ 2014  . SPERMATOCELECTOMY Right 03/18/2014   Procedure: RIGHT SPERMATOCELECTOMY;  Surgeon: Crist Fat, MD;  Location: [facility];  Service: Urology;  Laterality: Right;       Home Medications    Prior to Admission medications   Medication Sig Start Date End Date Taking? Authorizing Provider  diazepam (VALIUM) 5 MG tablet Take 1 tablet (5 mg total) by mouth every 6 (six) hours as needed for anxiety. 01/08/17   Nilda Riggs, NP  eszopiclone (LUNESTA) 2 MG TABS tablet Take 2 mg by mouth at bedtime as needed for sleep.   01/13/16   [provider]  HYDROcodone-acetaminophen (NORCO/VICODIN) 5-325 MG tablet Take 1 tablet by mouth every 12 (twelve) hours as needed for severe pain. 04/12/17   Derwood Kaplan, MD  ibuprofen (ADVIL,MOTRIN) 600 MG tablet Take 1 tablet (600 mg total) by mouth every 6 (six) hours as needed. 04/12/17   Derwood Kaplan, MD  methocarbamol (ROBAXIN) 500 MG tablet Take 1 tablet (500 mg total) by mouth 2 (two) times daily. 04/12/17   Derwood Kaplan, MD    Family History Family History  Problem Relation Age of Onset  . High Cholesterol Father   . Neuromuscular disorder Neg Hx     Social History Social History  Substance Use Topics  . Smoking status: Never Smoker  . Smokeless tobacco: Former Neurosurgeon    Types: Chew    Quit date: 01/09/2014     Comment: Smokeless tobacco  . Alcohol use Yes     Comment: Socially     Allergies   Patient has no known allergies.   Review of Systems Review of Systems  Musculoskeletal: Positive for neck pain.  Neurological: Positive for weakness and numbness.  All other systems reviewed and are negative.    Physical Exam Updated Vital Signs BP (!) 138/95   Pulse 84   Temp 98.3 F (36.8 C) (Oral)  Resp 16   SpO2 99%   Physical Exam  Constitutional: He is oriented to person, place, and time. He appears well-developed.  HENT:  Head: Atraumatic.  Neck: Neck supple.  Cardiovascular: Normal rate.   Pulmonary/Chest: Effort normal.  Neurological: He is alert and oriented to person, place, and time. No cranial nerve deficit. Coordination normal.  Subjective distal digit numbness for digit 3 and 4. Pt has 4+/5 upper extremity strength, but his grip appears slightly weak on the L side  Skin: Skin is warm.  Nursing note and vitals reviewed.    ED Treatments / Results  Labs (all labs ordered are listed, but only abnormal results are displayed) Labs Reviewed - No data to display  EKG  EKG Interpretation  Date/Time:  Saturday  April 12 2017 15:22:40 EDT Ventricular Rate:  86 PR Interval:  162 QRS Duration: 96 QT Interval:  358 QTC Calculation: 428 R Axis:   46 Text Interpretation:  Normal sinus rhythm Incomplete right bundle branch block Borderline ECG No acute changes No significant change since last tracing Confirmed by Derwood Kaplan [phone]) on 04/12/2017 4:08:28 PM       Radiology Mr Cervical Spine Wo Contrast  Result Date: 04/12/2017 CLINICAL DATA:  Gradual onset of pain after playing tennis yesterday, felt a pop. History of ACDF. EXAM: MRI CERVICAL SPINE WITHOUT CONTRAST TECHNIQUE: Multiplanar, multisequence MR imaging of the cervical spine was performed. No intravenous contrast was administered. COMPARISON:  Cervical spine radiograph May 24, 2016 and MRI of the cervical spine October 04, 2015 FINDINGS: ALIGNMENT: Straightened cervical lordosis.  No malalignment. VERTEBRAE/DISCS: Vertebral bodies are intact. Status post C4-5 ACDF at a regional susceptibility artifact. Intervertebral disc morphology's and signal are normal. No abnormal or acute bone marrow signal. CORD:Cervical spinal cord is normal morphology and signal characteristics from the cervicomedullary junction to level of T1-2, the most caudal well visualized level. POSTERIOR FOSSA, VERTEBRAL ARTERIES, PARASPINAL TISSUES: No MR findings of ligamentous injury. Vertebral artery flow voids present. Included posterior fossa and paraspinal soft tissues are normal. DISC LEVELS: C2-3 and C3-4: No disc bulge, canal stenosis nor neural foraminal narrowing. C4-5: ACDF. C5-6: Annular bulging without canal stenosis or neural foraminal narrowing. C6-7: Shallow LEFT central disc protrusion with annular fissure. No canal stenosis or neural foraminal narrowing. IMPRESSION: 1. Small C6-7 disc protrusion annular fissure without canal stenosis or neural foraminal narrowing at any level. 2. Status post C4-5 ACDF. Electronically Signed   By: Awilda Metro M.D.   On:  04/12/2017 18:22    Procedures Procedures (including critical care time)  Medications Ordered in ED Medications  HYDROmorphone (DILAUDID) injection 1 mg (1 mg Intravenous Given 04/12/17 1633)  diazepam (VALIUM) injection 5 mg (5 mg Intravenous Given 04/12/17 1633)  dexamethasone (DECADRON) injection 10 mg (10 mg Intravenous Given 04/12/17 1633)  ketorolac (TORADOL) 15 MG/ML injection 15 mg (15 mg Intravenous Given 04/12/17 1825)  fentaNYL (SUBLIMAZE) injection 100 mcg (100 mcg Intravenous Given 04/12/17 1829)     Initial Impression / Assessment and Plan / ED Course  I have reviewed the triage vital signs and the nursing notes.  Pertinent labs & imaging results that were available during my care of the patient were reviewed by me and considered in my medical decision making (see chart for details).     Pt with severe neck pain and associated numbness and tingling and weakness. He has hx of cspiine fusion. Concerns for possible movement of the hardware and nerve impingement. MRI ordered, results show no acute changes.  Results discussed with pt. Spine f/u  Advised.  Final Clinical Impressions(s) / ED Diagnoses   Final diagnoses:  Acute strain of neck muscle, initial encounter    New Prescriptions Discharge Medication List as of 04/12/2017  7:15 PM    START taking these medications   Details  HYDROcodone-acetaminophen (NORCO/VICODIN) 5-325 MG tablet Take 1 tablet by mouth every 12 (twelve) hours as needed for severe pain., Starting Sat 04/12/2017, Print    ibuprofen (ADVIL,MOTRIN) 600 MG tablet Take 1 tablet (600 mg total) by mouth every 6 (six) hours as needed., Starting Sat 04/12/2017, Print    methocarbamol (ROBAXIN) 500 MG tablet Take 1 tablet (500 mg total) by mouth 2 (two) times daily., Starting Sat 04/12/2017, Print         Derwood Kaplan, MD 04/13/17 [phone]

## 2017-05-25 ENCOUNTER — Emergency Department (HOSPITAL_BASED_OUTPATIENT_CLINIC_OR_DEPARTMENT_OTHER)
Admission: EM | Admit: 2017-05-25 | Discharge: 2017-05-25 | Disposition: A | Discharge status: Home / Self Care | Payer: BLUE CROSS/BLUE SHIELD | Attending: Emergency Medicine | Admitting: Emergency Medicine

## 2017-05-25 DIAGNOSIS — M542 Cervicalgia: Secondary | ICD-10-CM | POA: Diagnosis present

## 2017-05-25 DIAGNOSIS — M5412 Radiculopathy, cervical region: Secondary | ICD-10-CM | POA: Insufficient documentation

## 2017-05-25 NOTE — ED Provider Notes (Signed)
See Epic downtime documentation.   Roger Gonzales, Jonny RuizJohn, MD 05/25/17 [phone]

## 2017-05-25 NOTE — ED Notes (Signed)
All charting down on downtime including discharge.

## 2017-10-08 ENCOUNTER — Emergency Department (HOSPITAL_BASED_OUTPATIENT_CLINIC_OR_DEPARTMENT_OTHER)
Admission: EM | Admit: 2017-10-08 | Discharge: 2017-10-08 | Disposition: A | Discharge status: Home / Self Care | Payer: Commercial Managed Care - PPO | Attending: Emergency Medicine | Admitting: Emergency Medicine

## 2017-10-08 ENCOUNTER — Encounter (HOSPITAL_BASED_OUTPATIENT_CLINIC_OR_DEPARTMENT_OTHER): Payer: Self-pay | Admitting: *Deleted

## 2017-10-08 DIAGNOSIS — R531 Weakness: Secondary | ICD-10-CM | POA: Insufficient documentation

## 2017-10-08 DIAGNOSIS — Z87891 Personal history of nicotine dependence: Secondary | ICD-10-CM | POA: Insufficient documentation

## 2017-10-08 DIAGNOSIS — M542 Cervicalgia: Secondary | ICD-10-CM | POA: Diagnosis present

## 2017-10-08 DIAGNOSIS — Z79899 Other long term (current) drug therapy: Secondary | ICD-10-CM | POA: Diagnosis not present

## 2017-10-08 MED ORDER — HYDROMORPHONE HCL 1 MG/ML IJ SOLN: 2.0000 mg | Freq: Once | INTRAMUSCULAR | Status: DC

## 2017-10-08 MED ORDER — HYDROMORPHONE HCL 1 MG/ML IJ SOLN
2.0000 mg | Freq: Once | INTRAMUSCULAR | Status: AC
  Administered 2017-10-08: 2 mg via INTRAMUSCULAR
  Filled 2017-10-08: qty 2

## 2017-10-08 NOTE — ED Triage Notes (Signed)
Pt arrives ambulatory to treatment room with steady gait. He reports neck pain and left arm pain. Patient states he has hx of cervical fusion and over the past several months has had recurrent pain in his neck, treated by Dr. Ethelene Halamos for the same. States he has not slept in several nights d/t the pain, last Vicodin around 2200. Left arm weakness for 4-5 months.

## 2017-10-08 NOTE — ED Provider Notes (Signed)
[facility] Provider Note: Lowella Dell, MD, FACEP  CSN: [id number] MRN: [id number] ARRIVAL: 10/08/17 at 0156 ROOM: MH04/MH04   CHIEF COMPLAINT  Neck Pain   HISTORY OF PRESENT ILLNESS  10/08/17 4:22 AM Roger Gonzales is a 47 y.o. male with chronic neck pain due to degenerative disc disease.  The pain is located in his mid left neck.  It is sharp in nature and he rates it as an 8 out of 10.  It is worse with movement of the neck.  The pain worsened after having an epidural injection by Dr. Ethelene Hal about a week ago.  The pain was severe enough he was unable to sleep overnight despite taking 10 mg of hydrocodone about 10 hours ago.  He is having no new neurologic symptoms but does have some chronic left arm weakness.  Consultation with the [facility] state controlled substances database reveals the patient has received 120 hydrocodone/APAP 10/325 tablets monthly for the past several months.     Past Medical History:  Diagnosis Date  . DDD (degenerative disc disease), cervical   . Epididymal cyst    RIGHT  . Pinched nerve    cervical    Past Surgical History:  Procedure Laterality Date  . CERVICAL FUSION  04/12/2016  . RECONSTRUCTION MANDIBLE  age 30   correct bite bilateral upper  . REMOVAL HARDWARE RIGHT UPPER MANDIBLE  04/ 2015  . REMOVAL LIPOMA, BACK  03/ 2014  . SPERMATOCELECTOMY Right 03/18/2014   Procedure: RIGHT SPERMATOCELECTOMY;  Surgeon: Crist Fat, MD;  Location: [facility];  Service: Urology;  Laterality: Right;    Family History  Problem Relation Age of Onset  . High Cholesterol Father   . Neuromuscular disorder Neg Hx     Social History   Tobacco Use  . Smoking status: Never Smoker  . Smokeless tobacco: Former Neurosurgeon    Types: Chew  . Tobacco comment: Smokeless tobacco  Substance Use Topics  . Alcohol use: Yes    Comment: Socially  . Drug use: No    Prior to Admission medications   Medication Sig Start Date  End Date Taking? Authorizing Provider  diazepam (VALIUM) 5 MG tablet Take 1 tablet (5 mg total) by mouth every 6 (six) hours as needed for anxiety. 01/08/17   Nilda Riggs, NP  eszopiclone (LUNESTA) 2 MG TABS tablet Take 2 mg by mouth at bedtime as needed for sleep.  01/13/16   [provider]  HYDROcodone-acetaminophen (NORCO/VICODIN) 5-325 MG tablet Take 1 tablet by mouth every 12 (twelve) hours as needed for severe pain. 04/12/17   Derwood Kaplan, MD  ibuprofen (ADVIL,MOTRIN) 600 MG tablet Take 1 tablet (600 mg total) by mouth every 6 (six) hours as needed. 04/12/17   Derwood Kaplan, MD  methocarbamol (ROBAXIN) 500 MG tablet Take 1 tablet (500 mg total) by mouth 2 (two) times daily. 04/12/17   Derwood Kaplan, MD    Allergies Patient has no known allergies.   REVIEW OF SYSTEMS  Negative except as noted here or in the History of Present Illness.   PHYSICAL EXAMINATION  Initial Vital Signs Blood pressure 123/82, pulse (!) 59, temperature 97.9 F (36.6 C), temperature source Oral, resp. rate 16, height 6\' 1"  (1.854 m), weight 86.2 kg (190 lb), SpO2 100 %.  Examination General: Well-developed, well-nourished male in no acute distress; appearance consistent with age of record HENT: normocephalic; atraumatic Eyes: pupils equal, round and reactive to light; extraocular muscles intact Neck: supple Heart:  regular rate and rhythm Lungs: clear to auscultation bilaterally Abdomen: soft; nondistended; nontender; bowel sounds present Extremities: No deformity; full range of motion; pulses normal Neurologic: Awake, alert and oriented; motor function intact in all extremities without gross weakness of the left upper extremity; no facial droop Skin: Warm and dry Psychiatric: Normal mood and affect   RESULTS  Summary of this visit's results, reviewed by myself:   EKG Interpretation  Date/Time:    Ventricular Rate:    PR Interval:    QRS Duration:   QT Interval:    QTC  Calculation:   R Axis:     Text Interpretation:        Laboratory Studies: No results found for this or any previous visit (from the past 24 hour(s)). Imaging Studies: No results found.  ED COURSE  Nursing notes and initial vitals signs, including pulse oximetry, reviewed.  Vitals:   10/08/17 0211 10/08/17 0212  BP:  123/82  Pulse:  (!) 59  Resp:  16  Temp:  97.9 F (36.6 C)  TempSrc:  Oral  SpO2:  100%  Weight: 86.2 kg (190 lb)   Height: 6\' 1"  (1.854 m)    Patient advised that since he is on chronic narcotics we cannot prescribe any additional narcotics.  We will give him a shot of Dilaudid here in the emergency department and he will contact Dr. Ethelene Halamos concerning ongoing pain management.  PROCEDURES    ED DIAGNOSES     ICD-10-CM   1. Neck pain M54.2        Roger Gonzales, Roger RuizJohn, MD 10/08/17 [phone]

## 2017-11-13 ENCOUNTER — Other Ambulatory Visit: Payer: Self-pay

## 2017-11-13 ENCOUNTER — Emergency Department (HOSPITAL_COMMUNITY)
Admission: EM | Admit: 2017-11-13 | Discharge: 2017-11-13 | Disposition: A | Discharge status: Home / Self Care | Payer: Commercial Managed Care - PPO | Attending: Emergency Medicine | Admitting: Emergency Medicine

## 2017-11-13 ENCOUNTER — Encounter (HOSPITAL_COMMUNITY): Payer: Self-pay

## 2017-11-13 DIAGNOSIS — Z87891 Personal history of nicotine dependence: Secondary | ICD-10-CM | POA: Insufficient documentation

## 2017-11-13 DIAGNOSIS — M5030 Other cervical disc degeneration, unspecified cervical region: Secondary | ICD-10-CM

## 2017-11-13 DIAGNOSIS — J986 Disorders of diaphragm: Secondary | ICD-10-CM | POA: Diagnosis not present

## 2017-11-13 DIAGNOSIS — Z79899 Other long term (current) drug therapy: Secondary | ICD-10-CM | POA: Insufficient documentation

## 2017-11-13 DIAGNOSIS — R066 Hiccough: Secondary | ICD-10-CM

## 2017-11-13 NOTE — Discharge Instructions (Addendum)
Continue your pain medications.  Take muscle relaxants for possible spasms.  Follow-up with your surgeon, call them tomorrow and inform them that your pain is worse today.

## 2017-11-13 NOTE — ED Triage Notes (Signed)
Pt states he has history of phrenic nerve spasm after having spinal fusion to cervical neck about a year ago. States that this is the fourth time in a year and a half.

## 2017-11-13 NOTE — ED Provider Notes (Signed)
Patient placed in Quick Look pathway, seen and evaluated   Chief Complaint: "phrenic nerve spasm" x1 day  HPI:   Pt is a 47 y.o. y/o male  with a PMHx of chronic neck pain and DDD s/p cervical spine fusion, presenting today with c/o "phrenic nerve spasm" that began last night.  He states he has had this multiple times before.  He reports having left-sided neck pain and a sensation that his diaphragm is spasming which causes it to feel like it's "pushing the air out of the side of the lung".  He states when this is happened in the past he has gotten pain medication and Ativan, "whatever you feel comfortable with".  He denies any chest pain, shortness of breath, hiccuping, abdominal pain, nausea, vomiting, new or worsening numbness or tingling, focal weakness, or any other complaints at this time.  Of note, chart review reveals that he had an EMG/NCS done on 11[facility]04/17 at [facility] and this was negative. He also had an MRI of C-spine on 04/12/17 which showed small C6-7 disc protrusion annular fissure without canal stenosis or neural foraminal narrowing at any level.  ROS: +neck pain, +"diaphragm spasms". No CP, SOB, abd pain, n/v, new/worsening numbness/tingling, focal weakness, hiccups.   Physical Exam:  BP 120/85   Pulse 85   Temp 98.6 F (37 C) (Oral)   Resp 16   Ht 6\' 1"  (1.854 m)   Wt 83.9 kg (185 lb)   SpO2 99%   BMI 24.41 kg/m    Gen: No distress  Neuro: Awake and Alert  Skin: Warm    Focused Exam: lungs CTAB. Mild L sided paracervical and trapezius TTP and spasm, no midline spinal TTP and no bony stepoffs or deformities. Extremities NVI with soft compartments, FROM intact in all major joints of b/l extremities. Abdomen soft and nontender.   IMAGING/PROCEDURES:  EMG/NCS 11/04/17 @[facility] : Impression: This is a normal study. At this time there is no electrodiagnostic evidence of a left cervical radiculopathy, widespread polyneuropathy, widespread myopathy, or a phrenic neuropathy  causing diaphragmatic spasms.  MRI C-spine 04/12/17: IMPRESSION: 1. Small C6-7 disc protrusion annular fissure without canal stenosis or neural foraminal narrowing at any level. 2. Status post C4-5 ACDF.   Electronically Signed   By: Awilda Metroourtnay  Bloomer M.D.   On: 04/12/2017 18:22    Initiation of care has begun. The patient has been counseled on the process, plan, and necessity for staying for the completion/evaluation, and the remainder of the medical screening examination     [address], [place], [place] 11/13/17 1819    Alvira MondaySchlossman, Erin, MD 11/15/17 2138

## 2017-11-13 NOTE — ED Provider Notes (Signed)
[name] EMERGENCY DEPARTMENT Provider Note   CSN: [id number] Arrival date & time: 11/13/17  1729     History   Chief Complaint Chief Complaint  Patient presents with  . Spasms    HPI Roger Gonzales is a 47 y.o. male.  HPI Roger Gonzales is a 47 y.o. male presents to emergency department with complaint of neck pain.  Patient states he has chronic neck pain and has had degenerative disc disease and pain in his neck that has been worse over the last several weeks.  He reports pain is in the midline cervical spine and in the left trapezius muscle.  He states in the last few days he has also had "left diaphragm spasms from phrenic nerve problem."  He states he had EMG done 2 weeks ago which was negative for phrenic nerve abnormality.  He states however he feels like his left diaphragm is spasming and is hard for him to breathe.  He states normally when this happens he needs more pain medicine and IV Ativan or muscle relaxants.  He states oral muscle relaxants do not work.  He states he has Flexeril and Robaxin at home but he has not tried them.  He is taking oxycodone 10 mg which was prescribed by his orthopedics doctor with no relief.  He denies numbness or weakness in his extremities.  He denies any fever.  No chest pain or shortness of breath.  Past Medical History:  Diagnosis Date  . DDD (degenerative disc disease), cervical   . Epididymal cyst    RIGHT  . Pinched nerve    cervical    Patient Active Problem List   Diagnosis Date Noted  . Muscle spasms of neck 01/08/2017  . PNA (pneumonia)   . Respiratory acidosis 05/29/2016  . Acute respiratory failure with hypercapnia (HCC)   . Accidental drug overdose   . PAIN IN JOINT, UPPER ARM 01/20/2011    Past Surgical History:  Procedure Laterality Date  . CERVICAL FUSION  04/12/2016  . RECONSTRUCTION MANDIBLE  age 30   correct bite bilateral upper  . REMOVAL HARDWARE RIGHT UPPER MANDIBLE  04/ 2015  . REMOVAL  LIPOMA, BACK  03/ 2014  . SPERMATOCELECTOMY Right 03/18/2014   Procedure: RIGHT SPERMATOCELECTOMY;  Surgeon: Crist Fat, MD;  Location: [facility];  Service: Urology;  Laterality: Right;        Home Medications    Prior to Admission medications   Medication Sig Start Date End Date Taking? Authorizing Provider  diazepam (VALIUM) 5 MG tablet Take 1 tablet (5 mg total) by mouth every 6 (six) hours as needed for anxiety. 01/08/17   Nilda Riggs, NP  eszopiclone (LUNESTA) 2 MG TABS tablet Take 2 mg by mouth at bedtime as needed for sleep.  01/13/16   [provider]  HYDROcodone-acetaminophen (NORCO/VICODIN) 5-325 MG tablet Take 1 tablet by mouth every 12 (twelve) hours as needed for severe pain. 04/12/17   Derwood Kaplan, MD  ibuprofen (ADVIL,MOTRIN) 600 MG tablet Take 1 tablet (600 mg total) by mouth every 6 (six) hours as needed. 04/12/17   Derwood Kaplan, MD  methocarbamol (ROBAXIN) 500 MG tablet Take 1 tablet (500 mg total) by mouth 2 (two) times daily. 04/12/17   Derwood Kaplan, MD    Family History Family History  Problem Relation Age of Onset  . High Cholesterol Father   . Neuromuscular disorder Neg Hx     Social History Social History   Tobacco Use  .  Smoking status: Never Smoker  . Smokeless tobacco: Former NeurosurgeonUser    Types: Chew  . Tobacco comment: Smokeless tobacco  Substance Use Topics  . Alcohol use: Yes    Comment: Socially  . Drug use: No     Allergies   Patient has no known allergies.   Review of Systems Review of Systems  Constitutional: Negative for chills and fever.  Respiratory: Negative for cough, chest tightness and shortness of breath.   Cardiovascular: Negative for chest pain, palpitations and leg swelling.  Gastrointestinal: Negative for abdominal distention, abdominal pain, diarrhea, nausea and vomiting.  Genitourinary: Negative for dysuria, frequency, hematuria and urgency.  Musculoskeletal: Positive for  neck pain and neck stiffness. Negative for arthralgias and myalgias.  Skin: Negative for rash.  Allergic/Immunologic: Negative for immunocompromised state.  Neurological: Negative for dizziness, weakness, light-headedness, numbness and headaches.  All other systems reviewed and are negative.    Physical Exam Updated Vital Signs BP 120/85   Pulse 85   Temp 98.6 F (37 C) (Oral)   Resp 16   Ht 6\' 1"  (1.854 m)   Wt 83.9 kg (185 lb)   SpO2 99%   BMI 24.41 kg/m   Physical Exam  Constitutional: He appears well-developed and well-nourished. No distress.  HENT:  Head: Normocephalic and atraumatic.  Eyes: Conjunctivae are normal.  Neck: Neck supple.  No midline tenderness.  Tenderness to palpation over left trapezius muscle  Cardiovascular: Normal rate, regular rhythm and normal heart sounds.  Pulmonary/Chest: Effort normal. No respiratory distress. He has no wheezes. He has no rales.  Abdominal: Soft. Bowel sounds are normal. He exhibits no distension. There is no tenderness. There is no rebound.  Musculoskeletal: He exhibits no edema.  Full range of motion bilateral upper extremities.  Neurological: He is alert.  5 out of 5 and equal strength in bilateral upper extremities.  Distal radial pulses are equal and intact bilaterally.  Skin: Skin is warm and dry.  Nursing note and vitals reviewed.    ED Treatments / Results  Labs (all labs ordered are listed, but only abnormal results are displayed) Labs Reviewed - No data to display  EKG None  Radiology No results found.  Procedures Procedures (including critical care time)  Medications Ordered in ED Medications - No data to display   Initial Impression / Assessment and Plan / ED Course  I have reviewed the triage vital signs and the nursing notes.  Pertinent labs & imaging results that were available during my care of the patient were reviewed by me and considered in my medical decision making (see chart for  details).     Patient in the emergency department with chronic neck pain that has been getting worse with now what he states is "diaphragm spasms."  He is actually denying any pain in his chest he denies shortness of breath.  He states pain is only neck and left trapezius.  He is taking oxycodone 10 mg which is not helping.  He also states he sometimes when pain is this bad requires IV medications.  [facility], he received 6 prescriptions written for the last month and a half, for a total of 260 tab oxycodone 10 mg. These should have lasted him 85 days based on my calcifications.  The prescriptions from multiple providers which including Dr. Modesta Messingramus, his orthopedics and back surgeon, Dr. Lorin PicketScott, emergency department.  I discussed the fact with patient, explained to him because he has had some  many different prescriptions in the last month and a half, did not feel comfortable giving him any more opiates in the emergency department.  He does not appear to be in any distress at this time, his vital signs are normal.  He is neurovascularly intact here.  His lungs are clear.  He does not appear to be in any respiratory distress.  He also admitted to me that 2 months ago he was in [place] where he went to the emergency department the same thing and was admitted to the hospital for 2 days.  I do not think in his admission today.  I advised him to call his doctor tomorrow.  He has a long-standing history of neck pain and has been followed by pain specialist in the past.  He may need to look into being treated at the pain clinic again.  Vitals:   11/13/17 1800 11/13/17 1803 11/13/17 1804  BP:   120/85  Pulse: 85    Resp: 16    Temp: 98.6 F (37 C)    TempSrc: Oral    SpO2: 99%    Weight:  83.9 kg (185 lb)   Height:  6\' 1"  (1.854 m)     Final Clinical Impressions(s) / ED Diagnoses   Final diagnoses:  Degenerative disc disease, cervical  Spasm of diaphragm     ED Discharge Orders    None       Iona Coach 11/13/17 2017    Mesner, Barbara Cower, MD 11/17/17 2140

## 2017-11-19 ENCOUNTER — Emergency Department (HOSPITAL_BASED_OUTPATIENT_CLINIC_OR_DEPARTMENT_OTHER)
Admission: EM | Admit: 2017-11-19 | Discharge: 2017-11-19 | Disposition: A | Discharge status: Home / Self Care | Payer: Commercial Managed Care - PPO | Attending: Emergency Medicine | Admitting: Emergency Medicine

## 2017-11-19 ENCOUNTER — Encounter (HOSPITAL_BASED_OUTPATIENT_CLINIC_OR_DEPARTMENT_OTHER): Payer: Self-pay | Admitting: Emergency Medicine

## 2017-11-19 ENCOUNTER — Other Ambulatory Visit: Payer: Self-pay

## 2017-11-19 DIAGNOSIS — Z7289 Other problems related to lifestyle: Secondary | ICD-10-CM | POA: Diagnosis not present

## 2017-11-19 DIAGNOSIS — Z765 Malingerer [conscious simulation]: Secondary | ICD-10-CM

## 2017-11-19 DIAGNOSIS — R109 Unspecified abdominal pain: Secondary | ICD-10-CM

## 2017-11-19 DIAGNOSIS — M62838 Other muscle spasm: Secondary | ICD-10-CM | POA: Insufficient documentation

## 2017-11-19 DIAGNOSIS — Z87891 Personal history of nicotine dependence: Secondary | ICD-10-CM | POA: Diagnosis not present

## 2017-11-19 HISTORY — DX: Chronic pain syndrome: G89.4

## 2017-11-19 HISTORY — DX: Long term (current) use of opiate analgesic: Z79.891

## 2017-11-19 HISTORY — DX: Radiculopathy, cervical region: M54.12

## 2017-11-19 MED ORDER — DICYCLOMINE HCL 20 MG PO TABS: 20.0000 mg | ORAL_TABLET | Freq: Two times a day (BID) | ORAL | 0 refills | Status: DC

## 2017-11-19 MED ORDER — DICYCLOMINE HCL 10 MG PO CAPS
20.0000 mg | ORAL_CAPSULE | Freq: Once | ORAL | Status: AC
  Administered 2017-11-19: 20 mg via ORAL
  Filled 2017-11-19: qty 2

## 2017-11-19 MED ORDER — KETOROLAC TROMETHAMINE 60 MG/2ML IM SOLN
30.0000 mg | Freq: Once | INTRAMUSCULAR | Status: AC
  Administered 2017-11-19: 30 mg via INTRAMUSCULAR
  Filled 2017-11-19: qty 2

## 2017-11-19 NOTE — ED Provider Notes (Addendum)
[facility] EMERGENCY DEPARTMENT Provider Note   CSN: [id number] Arrival date & time: 11/19/17  4098     History   Chief Complaint Chief Complaint  Patient presents with  . Spasms    HPI Broc Caspers is a 47 y.o. male.  The history is provided by the patient.  Illness  This is a chronic problem. The current episode started more than 1 week ago. The problem occurs constantly. The problem has been gradually worsening. Pertinent negatives include no chest pain, no abdominal pain, no headaches and no shortness of breath. Nothing aggravates the symptoms. Nothing relieves the symptoms. Treatments tried: home narcotics, robaxin and flexeril and injections done by pain management.   The treatment provided no relief.  Patient has a h/o spasms and accidental overdose with recent normal EMG (see results below) who presents with diaphragmatic spasms that are worsening despite multiple interventions and chronic opioid pain medication.  Patient states his home medications are not working and injections are no longer working.  Steroids no longer do anything for him.  He reports his wife dropped him off for this.  Past Medical History:  Diagnosis Date  . Cervical radiculopathy, chronic   . Chronic pain syndrome   . DDD (degenerative disc disease), cervical   . Epididymal cyst    RIGHT  . Long term (current) use of opiate analgesic   . Pinched nerve    cervical    Patient Active Problem List   Diagnosis Date Noted  . Muscle spasms of neck 01/08/2017  . PNA (pneumonia)   . Respiratory acidosis 05/29/2016  . Acute respiratory failure with hypercapnia (HCC)   . Accidental drug overdose   . PAIN IN JOINT, UPPER ARM 01/20/2011    Past Surgical History:  Procedure Laterality Date  . CERVICAL FUSION  04/12/2016  . RECONSTRUCTION MANDIBLE  age 38   correct bite bilateral upper  . REMOVAL HARDWARE RIGHT UPPER MANDIBLE  04/ 2015  . REMOVAL LIPOMA, BACK  03/ 2014  .  SPERMATOCELECTOMY Right 03/18/2014   Procedure: RIGHT SPERMATOCELECTOMY;  Surgeon: Crist Fat, MD;  Location: [facility];  Service: Urology;  Laterality: Right;        Home Medications    Prior to Admission medications   Medication Sig Start Date End Date Taking? Authorizing Provider  cyclobenzaprine (FLEXERIL) 10 MG tablet Take 10 mg by mouth as needed for muscle spasms.   Yes [provider]  eszopiclone (LUNESTA) 2 MG TABS tablet Take 2 mg by mouth at bedtime as needed for sleep.  01/13/16  Yes [provider]  oxyCODONE-acetaminophen (PERCOCET) 10-325 MG tablet Take 1 tablet by mouth every 8 (eight) hours as needed for pain.   Yes [provider]    Family History Family History  Problem Relation Age of Onset  . High Cholesterol Father   . Neuromuscular disorder Neg Hx     Social History Social History   Tobacco Use  . Smoking status: Never Smoker  . Smokeless tobacco: Former Neurosurgeon    Types: Chew  . Tobacco comment: Smokeless tobacco  Substance Use Topics  . Alcohol use: Yes    Comment: Socially  . Drug use: No     Allergies   Patient has no known allergies.   Review of Systems Review of Systems  Respiratory: Negative for shortness of breath.   Cardiovascular: Negative for chest pain.  Gastrointestinal: Negative for abdominal pain.  Musculoskeletal: Positive for arthralgias.  Neurological: Negative for headaches.  All other systems reviewed and are negative.    Physical Exam Updated Vital Signs BP (!) 123/98   Pulse 96   Temp 98.1 F (36.7 C) (Oral)   Resp 18   Ht  (1.854 m)   Wt 83.9 kg (185 lb)   SpO2 99%   BMI 24.41 kg/m   Physical Exam  Constitutional: He is oriented to person, place, and time. He appears well-developed and well-nourished. No distress.  HENT:  Head: Normocephalic and atraumatic.  Nose: Nose normal.  Mouth/Throat: Oropharynx is clear and moist. No oropharyngeal exudate.    Eyes: Pupils are equal, round, and reactive to light. Conjunctivae are normal.  Neck: Normal range of motion. Neck supple.  Cardiovascular: Normal rate, regular rhythm, normal heart sounds and intact distal pulses.  Pulmonary/Chest: Effort normal and breath sounds normal. No stridor. He has no wheezes. He has no rales.  Abdominal: Soft. Bowel sounds are normal. He exhibits no mass. There is no tenderness. There is no rebound and no guarding.  Musculoskeletal: Normal range of motion. He exhibits no tenderness or deformity.  Neurological: He is alert and oriented to person, place, and time. He displays normal reflexes.  Skin: Skin is warm and dry. Capillary refill takes less than 2 seconds.  Psychiatric: He has a normal mood and affect.     ED Treatments / Results    Procedures Procedures (including critical care time)  Medications  ketorolac (TORADOL) injection 30 mg (30 mg Intramuscular Given 11/19/17 0445)  dicyclomine (BENTYL) capsule 20 mg (20 mg Oral Given 11/19/17 0448)     IMAGING/PROCEDURES:  EMG/NCS 11/04/17 : Impression: This is a normal study. At this time there is no electrodiagnostic evidence of a left cervical radiculopathy, widespread polyneuropathy, widespread myopathy, or a phrenic neuropathy causing diaphragmatic spasms.  MRI C-spine 04/12/17: IMPRESSION: 1. Small C6-7 disc protrusion annular fissure without canal stenosis or neural foraminal narrowing at any level. 2. Status post C4-5 ACDF.  [place]A Database:  4 RX for narcotics in [place] 2019, 19 providers have written narcotics.   11/07/2017  1 11/06/2017 Oxycodone-Acetaminophen 10-325 60 Ri Ram ns   11/05/2017  Eszopiclone 2 Mg Tablet 30 11/03/2017    11/03/2017 Oxycodone-Acetaminophen 10-325 30 Ca Kno  [id number]   10/22/2017 Oxycodone-Acetaminophen 10-325 30 Ri Ram  [id number]    10/08/2017 Oxycodone-Acetaminophen 10-325 10 Ri Ram  [id number]    10/02/2017 Hydrocodone-Acetamin 10-325 Mg 60 Ri Ram     10/01/2017 Eszopiclone 2 Mg Tablet 30Sc Hol  [id number]   10/01/2017 Hydrocodone-Acetamin 10-325 Mg 60 Ca Kno  [id number]   09/05/2017 Hydrocodone-Acetamin 10-325 Mg 60 Ri Ram  08/23/2017 Hydrocodone-Acetamin 10-325 Mg 60 Ri Ram   08/12/2017   07/10/2017   Final Clinical Impressions(s) / ED Diagnoses   The patient presents from [facility], states wife drove him but based on security footage with Caryl Asp and Bruce present, he drove himself.  He has a normal EMG and has been seen by multiple providers. He states he  States that Dr. Ethelene Hal is only orthopedics, but google lists Dr. Ethelene Hal as pain management.  Based on outstanding pill counts I agree with Lemont Fillers he should have ~ 85 days worth opioid pain medication so he should have plenty. Patient stated medication not helping and wife dropped him off.  I confronted the patient about this.  I also informed the patient politely with Caryl Asp present that he would not be getting any additional narcotics.    He is clearly doctor shopping as he has come  her from [employer] and drug seeking and I will not be giving any narcotics or benzos.  We have given a dose of toradol and a dose of PO bentyl as an antispasmotic.    Return for weakness, numbness, changes in vision or speech, fevers >100.4 unrelieved by medication, shortness of breath, intractable vomiting, or diarrhea, abdominal pain, Inability to tolerate liquids or food, cough, altered mental status or any concerns. No signs of systemic illness or infection. The patient is nontoxic-appearing on exam and vital signs are within normal limits.   I have reviewed the triage vital signs and the nursing notes. Pertinent labs &imaging results that were available during my care of the patient were reviewed by me and considered in my medical decision making (see chart for details).  After history, exam, and medical workup I feel the patient has been appropriately medically screened and is safe for  discharge home. Pertinent diagnoses were discussed with the patient. Patient was given return precautions.      Elyas Villamor, MD 11/19/17 4098    Cy Blamer, MD 11/19/17 1191

## 2017-11-19 NOTE — ED Notes (Signed)
Pt stated in triage that his wife dropped him off and would pick him up. Security footage viewed and pt visualized driving into ED parking lot  and walking into department.

## 2017-11-19 NOTE — ED Triage Notes (Signed)
Pt c/o phrenic nerve spasm. Pt reports hx of same. Pt was dropped off by wife.

## 2017-12-10 ENCOUNTER — Emergency Department (HOSPITAL_COMMUNITY)
Admission: EM | Admit: 2017-12-10 | Discharge: 2017-12-10 | Disposition: A | Discharge status: Home / Self Care | Payer: Commercial Managed Care - PPO | Attending: Emergency Medicine | Admitting: Emergency Medicine

## 2017-12-10 ENCOUNTER — Other Ambulatory Visit: Payer: Self-pay

## 2017-12-10 ENCOUNTER — Encounter (HOSPITAL_COMMUNITY): Payer: Self-pay | Admitting: Emergency Medicine

## 2017-12-10 DIAGNOSIS — Z87891 Personal history of nicotine dependence: Secondary | ICD-10-CM | POA: Insufficient documentation

## 2017-12-10 DIAGNOSIS — M62838 Other muscle spasm: Secondary | ICD-10-CM | POA: Diagnosis not present

## 2017-12-10 DIAGNOSIS — Z79891 Long term (current) use of opiate analgesic: Secondary | ICD-10-CM | POA: Diagnosis not present

## 2017-12-10 NOTE — ED Provider Notes (Signed)
[facility]-EMERGENCY DEPT Provider Note   CSN: [id number] Arrival date & time: 12/10/17  1725     History   Chief Complaint Chief Complaint  Patient presents with  . Back Pain    HPI Roger Gonzales is a 47 y.o. male.  HPI Roger Gonzales is a 47 y.o. male with history of cervical radiculopathy, prior cervical spine surgery, presents to emergency department complaining of neck spasms.  Patient states that he has had problems for several years with his neck where he believes that his bulging disks are compressing on his phrenic nerve and he gets intermittent diaphragmatic spasms and pain.  Currently he is not having any diaphragmatic spasms but states he is having severe spasms in bilateral paraspinal muscles of the neck.  He states "I know that later on tonight I will have spasms in my diaphragm."  He is currently taking oxycodone 10 mg for his pain that is prescribed by Dr. Ethelene Hal that he has had spasms earlier and was given prescription for office.  He states none Valiums which he ran out of.  He is requesting some more Valium for his spasms.  He states he called his doctor's office today and I told him to come to the ER.  He denies any new injuries.  No new numbness or weakness.  No fever.  Past Medical History:  Diagnosis Date  . Cervical radiculopathy, chronic   . Chronic pain syndrome   . DDD (degenerative disc disease), cervical   . Epididymal cyst    RIGHT  . Long term (current) use of opiate analgesic   . Pinched nerve    cervical    Patient Active Problem List   Diagnosis Date Noted  . Muscle spasms of neck 01/08/2017  . PNA (pneumonia)   . Respiratory acidosis 05/29/2016  . Acute respiratory failure with hypercapnia (HCC)   . Accidental drug overdose   . PAIN IN JOINT, UPPER ARM 01/20/2011    Past Surgical History:  Procedure Laterality Date  . CERVICAL FUSION  04/12/2016  . RECONSTRUCTION MANDIBLE  age 88   correct bite bilateral upper  .  REMOVAL HARDWARE RIGHT UPPER MANDIBLE  04/ 2015  . REMOVAL LIPOMA, BACK  03/ 2014  . SPERMATOCELECTOMY Right 03/18/2014   Procedure: RIGHT SPERMATOCELECTOMY;  Surgeon: Crist Fat, MD;  Location: [facility];  Service: Urology;  Laterality: Right;        Home Medications    Prior to Admission medications   Medication Sig Start Date End Date Taking? Authorizing Provider  cyclobenzaprine (FLEXERIL) 10 MG tablet Take 10 mg by mouth as needed for muscle spasms.    [provider]  dicyclomine (BENTYL) 20 MG tablet Take 1 tablet (20 mg total) by mouth 2 (two) times daily. 11/19/17   Palumbo, April, MD  eszopiclone (LUNESTA) 2 MG TABS tablet Take 2 mg by mouth at bedtime as needed for sleep.  01/13/16   [provider]  oxyCODONE-acetaminophen (PERCOCET) 10-325 MG tablet Take 1 tablet by mouth every 8 (eight) hours as needed for pain.    [provider]    Family History Family History  Problem Relation Age of Onset  . High Cholesterol Father   . Neuromuscular disorder Neg Hx     Social History Social History   Tobacco Use  . Smoking status: Never Smoker  . Smokeless tobacco: Former Neurosurgeon    Types: Chew  . Tobacco comment: Smokeless tobacco  Substance Use Topics  .  Alcohol use: Yes    Comment: Socially  . Drug use: No     Allergies   Patient has no known allergies.   Review of Systems Review of Systems  Constitutional: Negative for chills and fever.  Respiratory: Negative for cough, chest tightness and shortness of breath.   Cardiovascular: Negative for chest pain, palpitations and leg swelling.  Gastrointestinal: Negative for abdominal distention, abdominal pain, diarrhea, nausea and vomiting.  Genitourinary: Negative for dysuria, frequency, hematuria and urgency.  Musculoskeletal: Positive for arthralgias, neck pain and neck stiffness. Negative for myalgias.  Skin: Negative for rash.  Allergic/Immunologic: Negative for  immunocompromised state.  Neurological: Negative for dizziness, weakness, light-headedness, numbness and headaches.  All other systems reviewed and are negative.    Physical Exam Updated Vital Signs BP (!) 129/117 (BP Location: Left Arm)   Pulse (!) 117   Temp 98.3 F (36.8 C) (Oral)   Resp 18   SpO2 96%   Physical Exam  Constitutional: He is oriented to person, place, and time. He appears well-developed and well-nourished. No distress.  Eyes: Conjunctivae are normal.  Neck: Neck supple.  Full range of motion of the neck.  Cardiovascular: Normal rate.  Pulmonary/Chest: No respiratory distress.  Abdominal: He exhibits no distension.  Musculoskeletal:  Full range of motion of upper and lower extremities  Neurological: He is alert and oriented to person, place, and time.  Skin: Skin is warm and dry.  Nursing note and vitals reviewed.    ED Treatments / Results  Labs (all labs ordered are listed, but only abnormal results are displayed) Labs Reviewed - No data to display  EKG None  Radiology No results found.  Procedures Procedures (including critical care time)  Medications Ordered in ED Medications - No data to display   Initial Impression / Assessment and Plan / ED Course  I have reviewed the triage vital signs and the nursing notes.  Pertinent labs & imaging results that were available during my care of the patient were reviewed by me and considered in my medical decision making (see chart for details).     Patient in emergency department with chronic neck pain, states having spasms and requesting Valium.  He is currently prescribed oxycodone 10 mg and received 60 tablets on 5/17.  He states he is taking them but it is not helping.  He did get a prescription for 10 mg Valium 9 tablets on 5/1 and again on 5/13.  He states he ran out of this and wants some more.  He states when he called the office they told him to come to the ER and would not give him a refill.   He has been seen in our ER before with similar complaints and inconsistent stories.  I am strongly and highly concerned the patient may be drug-seeking.  He is in no acute distress on exam, he is moving his neck freely, he is using his both hands.  I do not think he needs any emergent imaging today given that he has not had any new numbness weakness or new injuries.  I have advised him to call Dr. Mardelle Matte office tomorrow again and see if they can fit him in to be seen.  Also advised him to use heating pad on his neck.  Offered Toradol which he refused.  Will discharge home with outpatient follow-up.  Vitals:   12/10/17 1737 12/10/17 1847 12/10/17 1848  BP: (!) 129/117 (!) 137/98   Pulse: (!) 117 (!) 110  Resp: 18 18   Temp: 98.3 F (36.8 C)    TempSrc: Oral    SpO2: 96% 95%   Weight:   88 kg (194 lb)  Height:    (1.854 m)     Final Clinical Impressions(s) / ED Diagnoses   Final diagnoses:  Muscle spasms of neck    ED Discharge Orders    None       Iona Coach 12/10/17 2135    Wynetta Fines, MD 12/10/17 2140

## 2017-12-10 NOTE — ED Triage Notes (Addendum)
Pt complaint of worsening neck and upper back pain over past 3 days; spinal fusion 1.5 years ago; denies recent injury.

## 2017-12-10 NOTE — Discharge Instructions (Addendum)
Continue oxycodone. Try heating pads and stretches. Try massage. Follow up with your doctor tomorrow.

## 2017-12-24 ENCOUNTER — Other Ambulatory Visit: Payer: Self-pay | Admitting: Orthopedic Surgery

## 2017-12-24 DIAGNOSIS — Z981 Arthrodesis status: Secondary | ICD-10-CM

## 2018-01-19 ENCOUNTER — Ambulatory Visit
Admission: RE | Admit: 2018-01-19 | Discharge: 2018-01-19 | Disposition: A | Discharge status: Home / Self Care | Payer: Commercial Managed Care - PPO | Source: Ambulatory Visit | Attending: Orthopedic Surgery | Admitting: Orthopedic Surgery

## 2018-01-19 DIAGNOSIS — Z981 Arthrodesis status: Secondary | ICD-10-CM

## 2018-02-26 ENCOUNTER — Other Ambulatory Visit: Payer: Self-pay | Admitting: Neurology

## 2018-02-27 ENCOUNTER — Other Ambulatory Visit: Payer: Self-pay | Admitting: Neurology

## 2018-02-27 MED ORDER — DIAZEPAM 10 MG PO TABS: 10.0000 mg | ORAL_TABLET | Freq: Four times a day (QID) | ORAL | 3 refills | Status: DC | PRN

## 2018-04-08 ENCOUNTER — Encounter: Payer: Self-pay | Admitting: Neurology

## 2018-04-08 ENCOUNTER — Ambulatory Visit (INDEPENDENT_AMBULATORY_CARE_PROVIDER_SITE_OTHER): Payer: Commercial Managed Care - PPO | Admitting: Neurology

## 2018-04-08 VITALS — BP 101/74 | HR 63 | Ht 72.0 in | Wt 205.0 lb

## 2018-04-08 DIAGNOSIS — S04892D Injury of other cranial nerves, left side, subsequent encounter: Secondary | ICD-10-CM

## 2018-04-08 DIAGNOSIS — M62838 Other muscle spasm: Secondary | ICD-10-CM | POA: Diagnosis not present

## 2018-04-08 MED ORDER — DIAZEPAM 10 MG PO TABS: 10.0000 mg | ORAL_TABLET | Freq: Four times a day (QID) | ORAL | 4 refills | Status: AC | PRN

## 2018-04-08 NOTE — Patient Instructions (Addendum)
Vagal Nerve Stimulator Or vagal nerve exercises? Will talk to PT.

## 2018-04-08 NOTE — Progress Notes (Signed)
[facility]    Provider:  Dr Lucia Gaskins Referring Provider: Marikay Alar MD Primary Care Physician:  Carolynn Serve, MD  CC:  Muscle spasms  Interval history 04/08/2018: He is here for follow up. Dr. Shon Baton believes with surgery he had a vagal nerve injury and causing. Vagal nerve injury after cervical spine surgery and it can happen. Or the phrenic nerve. I am just not sure how I would confirm the vagus nerve injury, there aren't any tests that I know of that I can do for you. You can't test the vagus nerve on EMG/NCS, so there is no objective test I know of - the diagnosis is done "clinically" via symptoms. I really can't confirm it anymore than Dr. Shon Baton can at this point. He had an emg/ncs and the phrenic nerve test was normal.    HPI:  Roger Gonzales is a 47 y.o. male here as a referral from Dr. Link Snuffer for muscle spasms. He had surgery with Dr. Yetta Barre in September and it went extremely well. Spasm happened 18 months ago, numbness in the hands and neck pain, he tried therapy and steroid injections. One night he started having a involuntary head movement a jerking movement and it became worse all evening, muscles in the posterior neck and then the muscle spasm would radiate into the chest from the neck, he would have difficulty breathing because of the muscle spasms in the back of the next and in the chest and tightness, the spasms would affect his speech and he could not talk. Whole body would spasm and then release, nor rhythmic, uncontrollable, no alteration of consciousness.  Ativan and Valium helped. It will last all day until it intervenes. The spasms will happen 8-10x a minute. Almost like a hiccup. He has them every 2-3 months. No triggers. Symmetric bilaterally. No other focal neurologic deficits, associated symptoms, inciting events or modifiable factors.  Reviewed notes, labs and imaging from outside physicians, which showed:  MRI cervical spine (personally reviewed  images and agree with the following):  Cervicomedullary junction is within normal limits. Spinal cord signal is within normal limits at all visualized levels.  Negative paraspinal soft tissues.  C2-C3:  Stable and negative.  C3-C4: Stable mild disc bulge and uncovertebral hypertrophy. No spinal stenosis. Mild C4 foraminal stenosis is stable.  C4-C5: Mild disc bulge. Small central disc protrusion appears stable to mildly increased. Borderline to mild spinal stenosis with no spinal cord mass effect. Mild uncovertebral hypertrophy. Mild to moderate C5 foraminal stenosis appears increased.  C5-C6: Mild circumferential disc bulge and uncovertebral hypertrophy. No spinal stenosis. Minimal to mild left and moderate right C6 foraminal stenosis are stable to mildly increased.  C6-C7: Mild disc bulge. No spinal stenosis. Minimal to mild C7 foraminal stenosis is unchanged.  C7-T1:  Stable mild facet hypertrophy.  No significant stenosis.  No upper thoracic spinal stenosis.  IMPRESSION: 1. Mild disc bulging and/or small central disc protrusions from C3-C4 to C6-C7. That at C4-C5 appears mildly progressed since 2014, with borderline to mild spinal stenosis and mild to moderate C5 foraminal stenosis. No spinal cord mass effect or signal abnormality. 2. Stable to mildly increased mild to moderate C6 foraminal stenosis greater on the right. 3. Other cervical spine levels appear stable  CBC and CMP 05/2016 showed wbc 11.4, mild anema hgb 12.3, cl 96, co2 35 otherwise unremarkable with nml bun/Cr  Reviewed notes from admission: Neurology consult was called for the evaluation of muscle spasm. Patient reported he had periods of severe muscle  spasm when he comes to ER. He stated, he gets some injection in the ER and goes home. Neurologist ordered a dose of Ativan and Reglan which helped. Patient clinically improved. Robaxin was also added for the management of muscle relaxant. Education  provided to the patient at bedside regarding tapering and stopping narcotics. He verbalized understanding. He was willing to try nonnarcotic pain medication including muscle relaxant NSAIDs or Tylenol. He said he will follow-up with his PCP, neurologist and neurosurgery. Patient also reported that he follows up at pain clinic.   Review of Systems: Patient complains of symptoms per HPI as well as the following symptoms: cramps, aching muscles. Pertinent negatives per HPI. All others negative.   Social History   Socioeconomic History  . Marital status: Married    Spouse name: Not on file  . Number of children: 0  . Years of education: College  . Highest education level: Not on file  Occupational History  . Occupation: Automotive engineer  Social Needs  . Financial resource strain: Not on file  . Food insecurity:    Worry: Not on file    Inability: Not on file  . Transportation needs:    Medical: Not on file    Non-medical: Not on file  Tobacco Use  . Smoking status: Never Smoker  . Smokeless tobacco: Former Neurosurgeon    Types: Chew  . Tobacco comment: Smokeless tobacco  Substance and Sexual Activity  . Alcohol use: Yes    Comment: Socially  . Drug use: No  . Sexual activity: Yes  Lifestyle  . Physical activity:    Days per week: Not on file    Minutes per session: Not on file  . Stress: Not on file  Relationships  . Social connections:    Talks on phone: Not on file    Gets together: Not on file    Attends religious service: Not on file    Active member of club or organization: Not on file    Attends meetings of clubs or organizations: Not on file    Relationship status: Not on file  . Intimate partner violence:    Fear of current or ex partner: Not on file    Emotionally abused: Not on file    Physically abused: Not on file    Forced sexual activity: Not on file  Other Topics Concern  . Not on file  Social History Narrative   Lives with spouse   Caffeine use: 1-2  beverage/day   Right handed    Family History  Problem Relation Age of Onset  . High Cholesterol Father   . Neuromuscular disorder Neg Hx     Past Medical History:  Diagnosis Date  . Cervical radiculopathy, chronic   . Chronic pain syndrome   . DDD (degenerative disc disease), cervical   . Epididymal cyst    RIGHT  . Long term (current) use of opiate analgesic   . Pinched nerve    cervical    Past Surgical History:  Procedure Laterality Date  . CERVICAL FUSION  04/12/2016  . RECONSTRUCTION MANDIBLE  age 11   correct bite bilateral upper  . REMOVAL HARDWARE RIGHT UPPER MANDIBLE  04/ 2015  . REMOVAL LIPOMA, BACK  03/ 2014  . SPERMATOCELECTOMY Right 03/18/2014   Procedure: RIGHT SPERMATOCELECTOMY;  Surgeon: Crist Fat, MD;  Location: [facility];  Service: Urology;  Laterality: Right;    Current Outpatient Medications  Medication Sig Dispense Refill  . diazepam (VALIUM)  10 MG tablet Take 1 tablet (10 mg total) by mouth every 6 (six) hours as needed for anxiety. 30 tablet 3  . oxyCODONE-acetaminophen (PERCOCET) 10-325 MG tablet Take 1 tablet by mouth every 8 (eight) hours as needed for pain.     No current facility-administered medications for this visit.     Allergies as of 04/08/2018  . (No Known Allergies)    Vitals: BP 101/74 (BP Location: Right Arm, Patient Position: Sitting)   Pulse 63   Ht 6' (1.829 m)   Wt 205 lb (93 kg)   BMI 27.80 kg/m  Last Weight:  Wt Readings from Last 1 Encounters:  04/08/18 205 lb (93 kg)   Last Height:   Ht Readings from Last 1 Encounters:  04/08/18 6' (1.829 m)   Physical exam: Exam: Gen: NAD, conversant, well nourised, well groomed                     CV: RRR, no MRG. No Carotid Bruits. No peripheral edema, warm, nontender Eyes: Conjunctivae clear without exudates or hemorrhage  Neuro: Detailed Neurologic Exam  Speech:    Speech is normal; fluent and spontaneous with normal comprehension.    Cognition:    The patient is oriented to person, place, and time;     recent and remote memory intact;     language fluent;     normal attention, concentration,     fund of knowledge Cranial Nerves:    The pupils are equal, round, and reactive to light. The fundi are normal and spontaneous venous pulsations are present. Visual fields are full to finger confrontation. Extraocular movements are intact. Trigeminal sensation is intact and the muscles of mastication are normal. The face is symmetric. The palate elevates in the midline. Hearing intact. Voice is normal. Shoulder shrug is normal. The tongue has normal motion without fasciculations.   Coordination:    Normal finger to nose and heel to shin. Normal rapid alternating movements.   Gait:    Heel-toe and tandem gait are normal.   Motor Observation:    No asymmetry, no atrophy, and no involuntary movements noted. Tone:    Normal muscle tone.    Posture:    Posture is normal. normal erect    Strength:    Strength is V/V in the upper and lower limbs.      Sensation: intact to LT     Reflex Exam:  DTR's:    Deep tendon reflexes in the upper and lower extremities are normal bilaterally.   Toes:    The toes are downgoing bilaterally.   Clonus:    Clonus is absent.    Assessment/Plan:  Patient with episodic bilateral neck and chest spasms history of cervical degenerative disc disease status post cervical fusion surgery in September 2017 and jerking without alteration of awareness necessitating ED visit and benzodiazepines.Can last hours. Uncertain etiology but Dr. Shon Baton suggests Vagal nerve injury which does fit.  - Continue benzos for relief - Will check with PT on any vagal exercises - He is seeing Dr. Shon Baton for follow up   Meds ordered this encounter  Medications  . diazepam (VALIUM) 10 MG tablet    Sig: Take 1 tablet (10 mg total) by mouth every 6 (six) hours as needed for anxiety.    Dispense:  30 tablet     Refill:  4    Cc: Dr. Yetta Barre and Clista Bernhardt, MD  [facility] [phone]  [place] [id number]  Phone [phone] Fax [phone]  A total of 25 minutes was spent face-to-face with this patient. Over half this time was spent on counseling patient on the  1. Injury of left vagus nerve, subsequent encounter   2. Muscle spasms of neck     diagnosis and different diagnostic and therapeutic options, counseling and coordination of care, risks ans benefits of management, compliance, or risk factor reduction and education.

## 2018-05-08 ENCOUNTER — Other Ambulatory Visit: Payer: Self-pay | Admitting: Neurology

## 2018-05-10 NOTE — Telephone Encounter (Signed)
Roger Gonzales, I just refilled this prescription last month and gave him 4 refills. Will ou call and see why he needs more? I will decline this script thanks

## 2018-05-11 ENCOUNTER — Telehealth: Payer: Self-pay | Admitting: Neurology

## 2018-05-11 ENCOUNTER — Other Ambulatory Visit: Payer: Self-pay | Admitting: Neurology

## 2018-05-11 DIAGNOSIS — G522 Disorders of vagus nerve: Secondary | ICD-10-CM

## 2018-05-11 NOTE — Telephone Encounter (Signed)
Spoke to patient, he says he has only refilled the script once. He is going to check on the prescription and email me and he is going to keep a diary of how often he takes the diazepam. Also let him know I spoke to PT an dthey dont know of any Vagal Nerve exercises.

## 2018-05-11 NOTE — Telephone Encounter (Signed)
Spoke with Roger Gonzales. He refilled 30 tablets on 9/24, 9/30, 10/6, 10/12 (partial on 10/12 and remaining 14 tablets on 10/16). He was allowed to refill early b/c his instructions say he can take every 6 hours as needed.

## 2018-05-11 NOTE — Telephone Encounter (Signed)
Patient was seen in September and was given a diazepam prescription with 4 refills. I received a refill request 30 days later so called patient to ask why he needed a refill so early. Patient said he didn't know, he is not using the medication daily and he has only refilled it once since September.  Checked the drug registry patient has filled 7 prescriptions for 30 diazepam since 03/22/2018. That equals 210 pills. He would have to be taking 4 pills daily to use all these and he endorsed to me he only uses it sporadically and definitely not daily for his vagal respiratory spasms. Also filling Oxycodone 30 tabes monthly. In the past I have questioned his use of oxy due to dangerous nature of using these 2 drugs together and in the past he told me he did not take oxy on a regular monthly schedule but appears he has been filling oxy monthly this entire year sometimes multiple prescriptions a month.  Patient will be dismissed from our practice due to patient-doctor relationship breakdown, patient not reporting his use of medication acurrately. Would suggest all his physicians monitor his drug database. Will send to his pcp.

## 2018-05-12 ENCOUNTER — Encounter: Payer: Self-pay | Admitting: Neurology

## 2018-06-20 ENCOUNTER — Encounter (HOSPITAL_COMMUNITY): Payer: Self-pay | Admitting: Emergency Medicine

## 2018-06-20 ENCOUNTER — Emergency Department (HOSPITAL_COMMUNITY): Payer: Commercial Managed Care - PPO

## 2018-06-20 ENCOUNTER — Emergency Department (HOSPITAL_COMMUNITY)
Admission: EM | Admit: 2018-06-20 | Discharge: 2018-06-20 | Disposition: A | Discharge status: Home / Self Care | Payer: Commercial Managed Care - PPO | Attending: Emergency Medicine | Admitting: Emergency Medicine

## 2018-06-20 DIAGNOSIS — R51 Headache: Secondary | ICD-10-CM | POA: Insufficient documentation

## 2018-06-20 DIAGNOSIS — M542 Cervicalgia: Secondary | ICD-10-CM | POA: Insufficient documentation

## 2018-06-20 DIAGNOSIS — G8929 Other chronic pain: Secondary | ICD-10-CM

## 2018-06-20 DIAGNOSIS — Z79899 Other long term (current) drug therapy: Secondary | ICD-10-CM | POA: Insufficient documentation

## 2018-06-20 LAB — I-STAT CHEM 8, ED
BUN: 17 mg/dL (ref 6–20)
CREATININE: 0.8 mg/dL (ref 0.61–1.24)
Calcium, Ion: 1.05 mmol/L — ABNORMAL LOW (ref 1.15–1.40)
Chloride: 103 mmol/L (ref 98–111)
GLUCOSE: 110 mg/dL — AB (ref 70–99)
HCT: 46 % (ref 39.0–52.0)
HEMOGLOBIN: 15.6 g/dL (ref 13.0–17.0)
POTASSIUM: 4.5 mmol/L (ref 3.5–5.1)
Sodium: 133 mmol/L — ABNORMAL LOW (ref 135–145)
TCO2: 26 mmol/L (ref 22–32)

## 2018-06-20 MED ORDER — DICYCLOMINE HCL 10 MG PO CAPS
10.0000 mg | ORAL_CAPSULE | Freq: Once | ORAL | Status: AC
  Administered 2018-06-20: 10 mg via ORAL
  Filled 2018-06-20: qty 1

## 2018-06-20 MED ORDER — KETOROLAC TROMETHAMINE 60 MG/2ML IM SOLN
60.0000 mg | Freq: Once | INTRAMUSCULAR | Status: AC
  Administered 2018-06-20: 60 mg via INTRAMUSCULAR
  Filled 2018-06-20: qty 2

## 2018-06-20 MED ORDER — DIAZEPAM 5 MG PO TABS
5.0000 mg | ORAL_TABLET | Freq: Once | ORAL | Status: AC
  Administered 2018-06-20: 5 mg via ORAL
  Filled 2018-06-20: qty 1

## 2018-06-20 MED ORDER — ONDANSETRON 4 MG PO TBDP
4.0000 mg | ORAL_TABLET | Freq: Once | ORAL | Status: AC
  Administered 2018-06-20: 4 mg via ORAL
  Filled 2018-06-20: qty 1

## 2018-06-20 MED ORDER — IOPAMIDOL (ISOVUE-370) INJECTION 76%
INTRAVENOUS | Status: AC
  Filled 2018-06-20: qty 100

## 2018-06-20 MED ORDER — OXYCODONE-ACETAMINOPHEN 5-325 MG PO TABS
1.0000 | ORAL_TABLET | Freq: Once | ORAL | Status: AC
  Administered 2018-06-20: 1 via ORAL
  Filled 2018-06-20: qty 1

## 2018-06-20 MED ORDER — IOPAMIDOL (ISOVUE-370) INJECTION 76%
100.0000 mL | Freq: Once | INTRAVENOUS | Status: AC | PRN
  Administered 2018-06-20: 100 mL via INTRAVENOUS

## 2018-06-20 NOTE — ED Triage Notes (Signed)
Pt was woken by " neck muscle spasms"  Hx of neck surgery.

## 2018-06-20 NOTE — Discharge Instructions (Signed)
The ED cannot provide narcotic pain medication or benzodiazepine medication.  You need to follow-up with your pain management doctor and neurologist.  Return to the ED if you develop fever, new weakness, new numbness, any other concerns.

## 2018-06-20 NOTE — ED Provider Notes (Signed)
[facility] EMERGENCY DEPARTMENT Provider Note   CSN: [id number] Arrival date & time: 06/20/18  0215     History   Chief Complaint Chief Complaint  Patient presents with  . Neck Pain    HPI Roger Gonzales is a 47 y.o. male.  Patient states he awoke with "neck spasms" in his posterior neck that have been ongoing for 24 hours now.  He has had the spasms in the past and this is similar to previous instances.  He used to be prescribed Valium but is no longer.  He is prescribed Percocet at home by his pain management Dr. Ethelene Hal.  He states he does not use pain medication on a regular basis.  He states that the pain became worse in his neck when he picked up his nephew yesterday.  Denies any direct fall or trauma.  Denies any focal weakness, numbness or tingling.  No fever.  No bowel or bladder incontinence.  chart review reveals that he had an EMG/NCS done on 11/04/17 at [facility] and this was negative. He also had an MRI of C-spine on 04/12/17 which showed small C6-7 disc protrusion annular fissure without canal stenosis or neural foraminal narrowing at any level.  The history is provided by the patient.    Past Medical History:  Diagnosis Date  . Cervical radiculopathy, chronic   . Chronic pain syndrome   . DDD (degenerative disc disease), cervical   . Epididymal cyst    RIGHT  . Long term (current) use of opiate analgesic   . Pinched nerve    cervical    Patient Active Problem List   Diagnosis Date Noted  . Muscle spasms of neck 01/08/2017  . PNA (pneumonia)   . Respiratory acidosis 05/29/2016  . Acute respiratory failure with hypercapnia (HCC)   . Accidental drug overdose   . PAIN IN JOINT, UPPER ARM 01/20/2011    Past Surgical History:  Procedure Laterality Date  . CERVICAL FUSION  04/12/2016  . RECONSTRUCTION MANDIBLE  age 55   correct bite bilateral upper  . REMOVAL HARDWARE RIGHT UPPER MANDIBLE  04/ 2015  . REMOVAL LIPOMA, BACK  03/ 2014    . SPERMATOCELECTOMY Right 03/18/2014   Procedure: RIGHT SPERMATOCELECTOMY;  Surgeon: Crist Fat, MD;  Location: [facility];  Service: Urology;  Laterality: Right;        Home Medications    Prior to Admission medications   Medication Sig Start Date End Date Taking? Authorizing Provider  diazepam (VALIUM) 10 MG tablet Take 1 tablet (10 mg total) by mouth every 6 (six) hours as needed for anxiety. 04/08/18   Anson Fret, MD  oxyCODONE-acetaminophen (PERCOCET) 10-325 MG tablet Take 1 tablet by mouth every 8 (eight) hours as needed for pain.    [provider]    Family History Family History  Problem Relation Age of Onset  . High Cholesterol Father   . Neuromuscular disorder Neg Hx     Social History Social History   Tobacco Use  . Smoking status: Never Smoker  . Smokeless tobacco: Former Neurosurgeon    Types: Chew  . Tobacco comment: Smokeless tobacco  Substance Use Topics  . Alcohol use: Yes    Comment: Socially  . Drug use: No     Allergies   Patient has no known allergies.   Review of Systems Review of Systems  Constitutional: Negative for activity change, appetite change and fever.  Respiratory: Negative for cough, chest tightness and  shortness of breath.   Cardiovascular: Negative for chest pain.  Gastrointestinal: Negative for abdominal pain, nausea and vomiting.  Genitourinary: Negative for dysuria and hematuria.  Musculoskeletal: Positive for neck pain.  Skin: Negative for wound.  Neurological: Negative for dizziness, weakness, light-headedness and headaches.    all other systems are negative except as noted in the HPI and PMH.    Physical Exam Updated Vital Signs BP (!) 153/114 (BP Location: Right Arm)   Pulse 77   Temp 97.8 F (36.6 C) (Oral)   Resp 20   SpO2 100%   Physical Exam  Constitutional: He is oriented to person, place, and time. He appears well-developed and well-nourished. No distress.  HENT:  Head:  Normocephalic and atraumatic.  Mouth/Throat: Oropharynx is clear and moist. No oropharyngeal exudate.  Eyes: Pupils are equal, round, and reactive to light. Conjunctivae and EOM are normal.  Neck: Normal range of motion. Neck supple.  No palpable spasm.  No midline tenderness, full range of motion of neck  Cardiovascular: Normal rate, regular rhythm, normal heart sounds and intact distal pulses.  No murmur heard. Pulmonary/Chest: Effort normal and breath sounds normal. No respiratory distress. He exhibits no tenderness.  Abdominal: Soft. There is no tenderness. There is no rebound and no guarding.  Musculoskeletal: Normal range of motion. He exhibits no edema or tenderness.  Neurological: He is alert and oriented to person, place, and time. No cranial nerve deficit. He exhibits normal muscle tone. Coordination normal.  No ataxia on finger to nose bilaterally. No pronator drift. 5/5 strength throughout. CN 2-12 intact.Equal grip strength. Sensation intact.   Skin: Skin is warm.  Psychiatric: He has a normal mood and affect. His behavior is normal.  Nursing note and vitals reviewed.    ED Treatments / Results  Labs (all labs ordered are listed, but only abnormal results are displayed) Labs Reviewed  I-STAT CHEM 8, ED - Abnormal; Notable for the following components:      Result Value   Sodium 133 (*)    Glucose, Bld 110 (*)    Calcium, Ion 1.05 (*)    All other components within normal limits    EKG None  Radiology Dg Cervical Spine Complete  Result Date: 06/20/2018 CLINICAL DATA:  Cervical neck pain. No known injury. EXAM: CERVICAL SPINE - COMPLETE 4+ VIEW COMPARISON:  CT 01/19/2018 FINDINGS: Cervical spine alignment is maintained. Vertebral body heights are preserved. C4-C5 ACDF unchanged from prior exam with pseudoarthrosis. Slight C3-C4 disc space narrowing also unchanged. The dens is intact. Posterior elements appear well-aligned. There is no evidence of fracture. No  prevertebral soft tissue edema. IMPRESSION: 1. No acute abnormality. 2. C4-C5 ACDF, unchanged from prior CT. Electronically Signed   By: Narda Rutherford M.D.   On: 06/20/2018 03:05    Procedures Procedures (including critical care time)  Medications Ordered in ED Medications  ketorolac (TORADOL) injection 60 mg (has no administration in time range)     Initial Impression / Assessment and Plan / ED Course  I have reviewed the triage vital signs and the nursing notes.  Pertinent labs & imaging results that were available during my care of the patient were reviewed by me and considered in my medical decision making (see chart for details).    Patient presents with recurrent neck spasms.  He has been seen for this issue multiple times in multiple emergency departments.  He denies any new trauma.  No focal weakness, numbness or tingling.  No fever.  No new neuro  deficits.  [facility].  Patient receives monthly prescriptions of 120 10 mg Percocets last filled on November 24. He also receives frequent prescriptions of 10 mg Valium last filled on November 13 of 9 tablets.  Before this he had 10 tablets dispensed on October 16 and 16 tablets dispensed on October 12.  There is a note from Patient's neurologist Dr. Lucia GaskinsAhern on 05/11/18 saying that she would no longer prescribe patient diazepam due to him running out early, receiving prescriptions from multiple sources.  Dr. Lucia GaskinsAhern prescribed diazepam with 4 refills in September and it appears patient filled these weekly and well before he was due. Prescriptions were filled on 9/18 (30), 9/24 (30), 9/30 (30), 10/6 (30), 10/12 (16), 10/16 (14), 11/13 (9).  D/w patient that he will not be able to be prescribed any narcotics or benzodiazepines from the ED. He understands this. Offered patient one dose of PO valium if he can obtain a ride home. Per previous records, patient has been untruthful about driving himself to the  ED.  Patient's wife has arrived and agreed to drive him home. PO valium given. With new hypertension, CTA obtained which is negative for any acute pathology. Suspect elevated BP due to pain which will need outpatient followup.   Patient to followup with his specialists. No evidence of acute neurovascular compromise or life threatening benzodiazepine withdrawal.  Return precautions discussed.  Final Clinical Impressions(s) / ED Diagnoses   Final diagnoses:  Chronic neck pain    ED Discharge Orders    None       Tino Ronan, Jeannett SeniorStephen, MD 06/20/18 [phone]

## 2018-06-20 NOTE — ED Notes (Signed)
Nurse starting IV and will collect labs. 

## 2018-06-20 NOTE — ED Notes (Signed)
Patient transported to X-ray 

## 2018-08-08 IMAGING — CT CT CERVICAL SPINE W/O CM
2 series · 10 of 14 positions shown, 12 images · non-contrast
Comparison: MRI 07/18/2017 at [facility].

CLINICAL DATA: Spinal fusion, recent neck spasms.

EXAM:
CT CERVICAL SPINE WITHOUT CONTRAST
TECHNIQUE: Multidetector CT imaging of the cervical spine was performed without
intravenous contrast. Multiplanar CT image reconstructions were also
generated.

[Series 3: cspine soft · axial · 0.34mm/px · z∈[-292,-164]mm · 5 of 98 slices shown]
[im 17/98  soft-tissue]
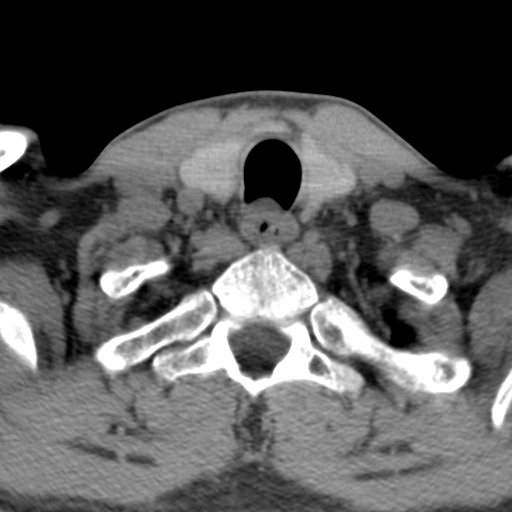
[im 33/98  soft-tissue]
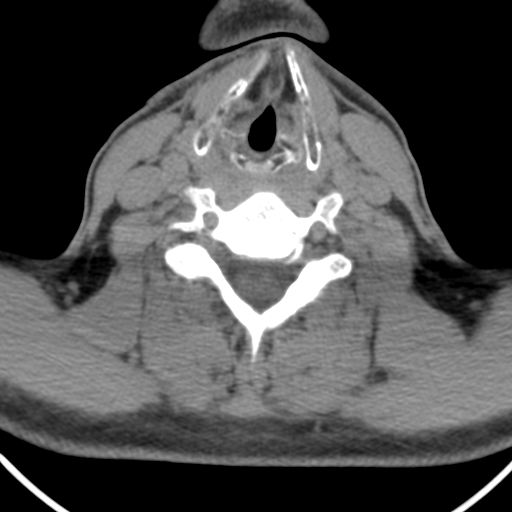
[im 49/98  soft-tissue]
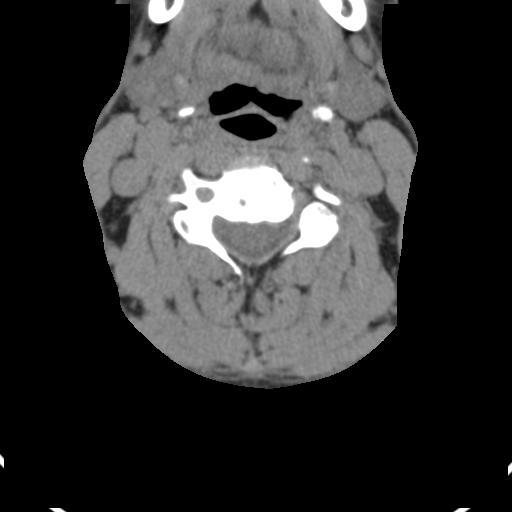
[im 65/98  soft-tissue]
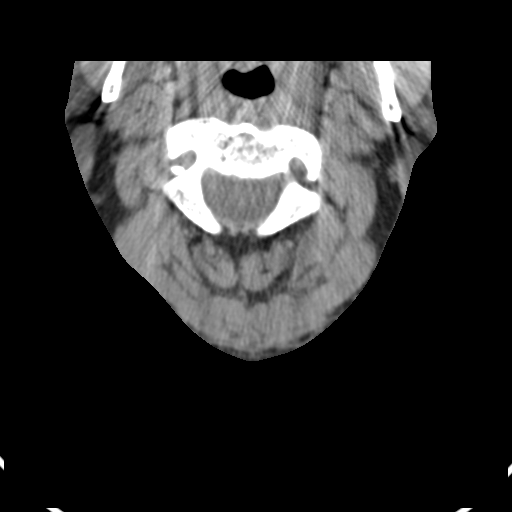
[im 81/98  soft-tissue]
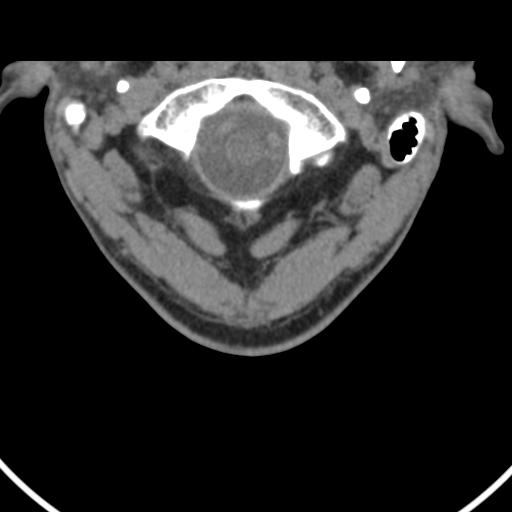

[Series 9: angled axial · axial · 0.29mm/px · z∈[-327,-192]mm · 5 of 112 slices shown, 7 images]
[im 19/112  soft-tissue]
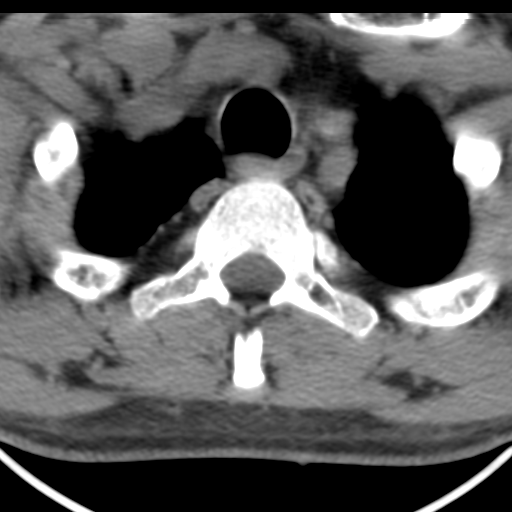
[im 19/112  bone]
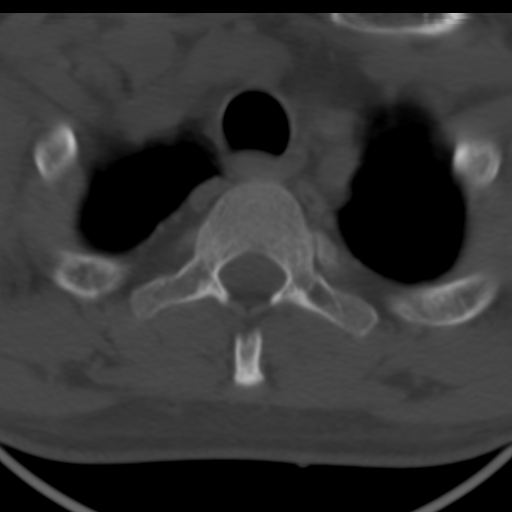
[im 38/112  bone]
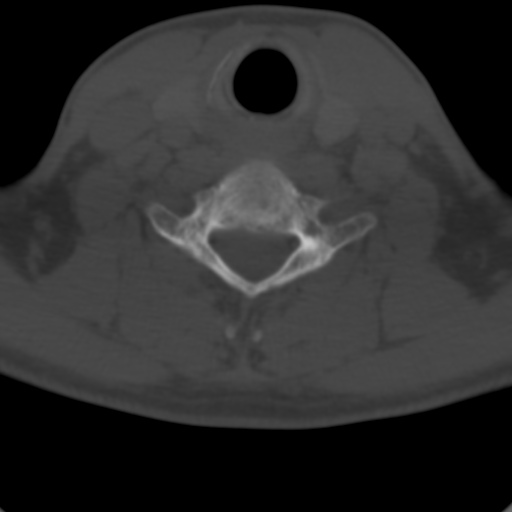
[im 56/112  bone]
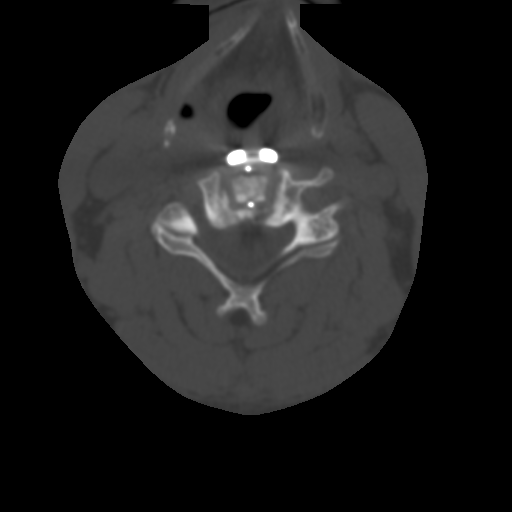
[im 75/112  bone]
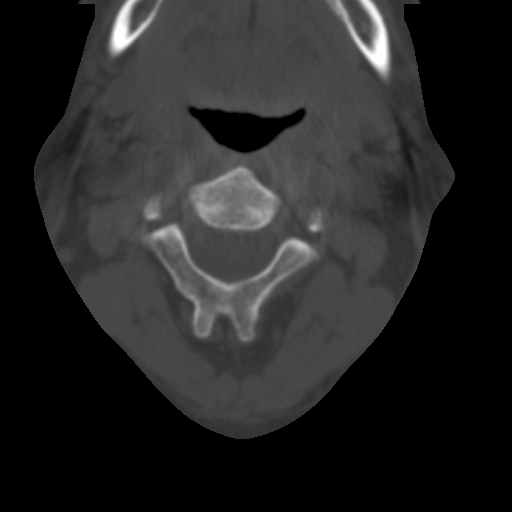
[im 93/112  soft-tissue]
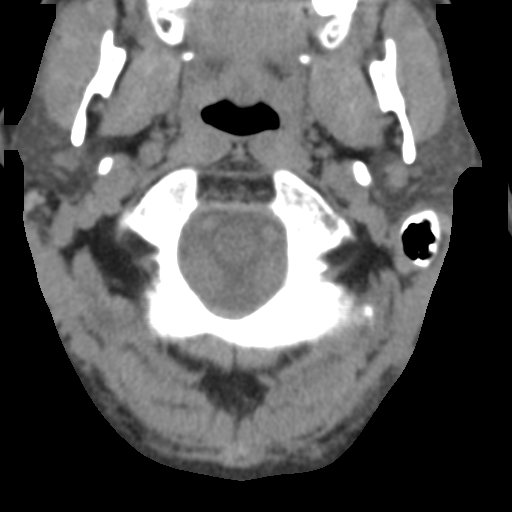
[im 93/112  bone]
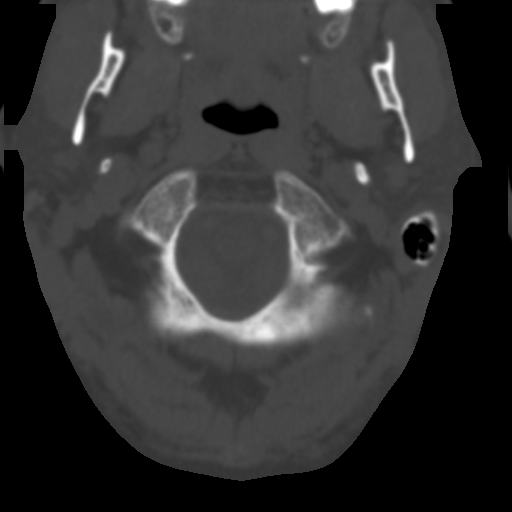

[10 of 14 positions shown; findings below may reference images not displayed]

FINDINGS: Alignment: Straightening, and slight reversal, normal cervical
lordosis. No significant subluxation.

Skull base and vertebrae: No worrisome osseous lesion.

Soft tissues and spinal canal: No prevertebral fluid or swelling. No
visible canal hematoma.

Disc levels:

C2-3:  Normal.

C3-4: Mild disc space narrowing. LEFT uncinate spurring. LEFT C4
foraminal narrowing.

C4-5: Pseudarthrosis. No solid interbody or posterior arthrodesis.
Hardware grossly intact.

C5-6:  Unremarkable.

C6-7:  Unremarkable.

C7-T1:  Unremarkable.

Upper chest: No pneumothorax or mass.

Other: None.
IMPRESSION: Status post C4-5 ACDF. Pseudarthrosis. No solid interbody or
posterior arthrodesis.

Mild adjacent segment disease C3-4, uncinate spurring on the LEFT.

## 2020-03-15 ENCOUNTER — Telehealth (HOSPITAL_COMMUNITY): Payer: Self-pay | Admitting: Psychiatry

## 2020-03-15 NOTE — Telephone Encounter (Signed)
D:  Kat from [facility] called to refer pt to CD-IOP.  A:  Pt will meet with Fulton Reek, LCSW, LCAS on 03-28-20 @ 9 a.m (arrive at 8:30 a.m wearing a mask and bring insurance card).

## 2020-03-28 ENCOUNTER — Ambulatory Visit (HOSPITAL_COMMUNITY): Payer: Self-pay | Admitting: Licensed Clinical Social Worker

## 2023-05-08 ENCOUNTER — Emergency Department (HOSPITAL_BASED_OUTPATIENT_CLINIC_OR_DEPARTMENT_OTHER)
Admission: EM | Admit: 2023-05-08 | Discharge: 2023-05-08 | Disposition: A | Discharge status: Home / Self Care | Payer: BC Managed Care – PPO | Attending: Emergency Medicine | Admitting: Emergency Medicine

## 2023-05-08 ENCOUNTER — Other Ambulatory Visit: Payer: Self-pay

## 2023-05-08 ENCOUNTER — Emergency Department (HOSPITAL_BASED_OUTPATIENT_CLINIC_OR_DEPARTMENT_OTHER): Payer: BC Managed Care – PPO

## 2023-05-08 ENCOUNTER — Encounter (HOSPITAL_BASED_OUTPATIENT_CLINIC_OR_DEPARTMENT_OTHER): Payer: Self-pay

## 2023-05-08 DIAGNOSIS — R1084 Generalized abdominal pain: Secondary | ICD-10-CM | POA: Insufficient documentation

## 2023-05-08 DIAGNOSIS — Z87891 Personal history of nicotine dependence: Secondary | ICD-10-CM | POA: Diagnosis not present

## 2023-05-08 DIAGNOSIS — R109 Unspecified abdominal pain: Secondary | ICD-10-CM | POA: Diagnosis present

## 2023-05-08 LAB — CBC WITH DIFFERENTIAL/PLATELET
Abs Immature Granulocytes: 0.01 10*3/uL (ref 0.00–0.07)
Basophils Absolute: 0 10*3/uL (ref 0.0–0.1)
Basophils Relative: 0 %
Eosinophils Absolute: 0 10*3/uL (ref 0.0–0.5)
Eosinophils Relative: 0 %
HCT: 42.9 % (ref 39.0–52.0)
Hemoglobin: 15.1 g/dL (ref 13.0–17.0)
Immature Granulocytes: 0 %
Lymphocytes Relative: 31 %
Lymphs Abs: 1.7 10*3/uL (ref 0.7–4.0)
MCH: 28.9 pg (ref 26.0–34.0)
MCHC: 35.2 g/dL (ref 30.0–36.0)
MCV: 82.2 fL (ref 80.0–100.0)
Monocytes Absolute: 0.3 10*3/uL (ref 0.1–1.0)
Monocytes Relative: 6 %
Neutro Abs: 3.4 10*3/uL (ref 1.7–7.7)
Neutrophils Relative %: 63 %
Platelets: 202 10*3/uL (ref 150–400)
RBC: 5.22 MIL/uL (ref 4.22–5.81)
RDW: 12.2 % (ref 11.5–15.5)
WBC: 5.4 10*3/uL (ref 4.0–10.5)
nRBC: 0 % (ref 0.0–0.2)

## 2023-05-08 LAB — LIPASE, BLOOD: Lipase: <10 U/L — ABNORMAL LOW (ref 11–51)

## 2023-05-08 LAB — COMPREHENSIVE METABOLIC PANEL
ALT: 66 U/L — ABNORMAL HIGH (ref 0–44)
AST: 38 U/L (ref 15–41)
Albumin: 4.8 g/dL (ref 3.5–5.0)
Alkaline Phosphatase: 85 U/L (ref 38–126)
Anion gap: 10 (ref 5–15)
BUN: 10 mg/dL (ref 6–20)
CO2: 29 mmol/L (ref 22–32)
Calcium: 9.6 mg/dL (ref 8.9–10.3)
Chloride: 101 mmol/L (ref 98–111)
Creatinine, Ser: 1.08 mg/dL (ref 0.61–1.24)
GFR, Estimated: >60 mL/min (ref >60)
Glucose, Bld: 125 mg/dL — ABNORMAL HIGH (ref 70–99)
Potassium: 3.4 mmol/L — ABNORMAL LOW (ref 3.5–5.1)
Sodium: 140 mmol/L (ref 135–145)
Total Bilirubin: 1.4 mg/dL — ABNORMAL HIGH (ref 0.3–1.2)
Total Protein: 7.4 g/dL (ref 6.5–8.1)

## 2023-05-08 MED ORDER — HYDROMORPHONE HCL 1 MG/ML IJ SOLN
0.5000 mg | Freq: Once | INTRAMUSCULAR | Status: AC
  Administered 2023-05-08: 0.5 mg via INTRAVENOUS
  Filled 2023-05-08: qty 1

## 2023-05-08 MED ORDER — IOHEXOL 300 MG/ML  SOLN
100.0000 mL | Freq: Once | INTRAMUSCULAR | Status: AC | PRN
  Administered 2023-05-08: 85 mL via INTRAVENOUS

## 2023-05-08 MED ORDER — ONDANSETRON HCL 4 MG/2ML IJ SOLN
4.0000 mg | Freq: Once | INTRAMUSCULAR | Status: AC
  Administered 2023-05-08: 4 mg via INTRAVENOUS
  Filled 2023-05-08: qty 2

## 2023-05-08 NOTE — Discharge Instructions (Addendum)
Workup here today without any acute findings.  There were some areas in the liver that were too small to adequately define but follow-up with primary care doctor and gastroenterology would be important.

## 2023-05-08 NOTE — ED Provider Notes (Signed)
[place] EMERGENCY DEPARTMENT AT [facility] Provider Note   CSN: [id number] Arrival date & time: 05/08/23  8295     History  Chief Complaint  Patient presents with   Abdominal Pain    Roger Gonzales is a 52 y.o. male.  Patient with a complaint of decreased appetite and abdominal pain for 2-1/2 weeks.  Is gotten worse in the last few days.  Patient also states nothing tastes good and nothing smells good.  He was checked for COVID respiratory infection September 26.  Was negative for COVID.  Patient has nausea but no vomiting.  Patient did have some diarrhea a few days ago but that has resolved.  Denies any blood.  Past medical history significant for cervical radiculopathy history of chronic pain.  Patient former smoker quit in 2015.  Patient last seen by [place] in 2019.  That was for chronic neck pain.       Home Medications Prior to Admission medications   Medication Sig Start Date End Date Taking? Authorizing Provider  diazepam (VALIUM) 10 MG tablet Take 1 tablet (10 mg total) by mouth every 6 (six) hours as needed for anxiety. Patient not taking: Reported on 06/20/2018 04/08/18   Anson Fret, MD  oxyCODONE-acetaminophen (PERCOCET) 10-325 MG tablet Take 1 tablet by mouth every 8 (eight) hours as needed for pain.    [provider]      Allergies    Patient has no known allergies.    Review of Systems   Review of Systems  Constitutional:  Negative for chills and fever.  HENT:  Negative for ear pain and sore throat.   Eyes:  Negative for pain and visual disturbance.  Respiratory:  Negative for cough and shortness of breath.   Cardiovascular:  Negative for chest pain and palpitations.  Gastrointestinal:  Positive for abdominal pain, diarrhea and nausea. Negative for vomiting.  Genitourinary:  Negative for dysuria and hematuria.  Musculoskeletal:  Negative for arthralgias and back pain.  Skin:  Negative for color change and rash.  Neurological:   Negative for seizures and syncope.  All other systems reviewed and are negative.   Physical Exam Updated Vital Signs BP (!) 154/97   Pulse (!) 50   Temp 98.1 F (36.7 C) (Oral)   Resp 16   Ht 1.854 m (6\' 1" )   Wt 87.1 kg   SpO2 98%   BMI 25.33 kg/m  Physical Exam Vitals and nursing note reviewed.  Constitutional:      General: He is not in acute distress.    Appearance: Normal appearance. He is well-developed. He is not ill-appearing.  HENT:     Head: Normocephalic and atraumatic.     Mouth/Throat:     Mouth: Mucous membranes are moist.  Eyes:     Conjunctiva/sclera: Conjunctivae normal.  Cardiovascular:     Rate and Rhythm: Normal rate and regular rhythm.     Heart sounds: No murmur heard. Pulmonary:     Effort: Pulmonary effort is normal. No respiratory distress.     Breath sounds: Normal breath sounds.  Abdominal:     Palpations: Abdomen is soft.     Tenderness: There is no abdominal tenderness. There is no guarding.  Musculoskeletal:        General: No swelling.     Cervical back: Neck supple.  Skin:    General: Skin is warm and dry.     Capillary Refill: Capillary refill takes less than 2 seconds.  Neurological:  General: No focal deficit present.     Mental Status: He is alert and oriented to person, place, and time.  Psychiatric:        Mood and Affect: Mood normal.     ED Results / Procedures / Treatments   Labs (all labs ordered are listed, but only abnormal results are displayed) Labs Reviewed  COMPREHENSIVE METABOLIC PANEL - Abnormal; Notable for the following components:      Result Value   Potassium 3.4 (*)    Glucose, Bld 125 (*)    ALT 66 (*)    Total Bilirubin 1.4 (*)    All other components within normal limits  LIPASE, BLOOD - Abnormal; Notable for the following components:   Lipase <10 (*)    All other components within normal limits  CBC WITH DIFFERENTIAL/PLATELET    EKG None  Radiology CT ABDOMEN PELVIS W  CONTRAST  Result Date: 05/08/2023 CLINICAL DATA:  Abdominal pain, acute, nonlocalized. EXAM: CT ABDOMEN AND PELVIS WITH CONTRAST TECHNIQUE: Multidetector CT imaging of the abdomen and pelvis was performed using the standard protocol following bolus administration of intravenous contrast. RADIATION DOSE REDUCTION: This exam was performed according to the departmental dose-optimization program which includes automated exposure control, adjustment of the mA and/or kV according to patient size and/or use of iterative reconstruction technique. CONTRAST:  85mL OMNIPAQUE IOHEXOL 300 MG/ML  SOLN COMPARISON:  None Available. FINDINGS: Lower chest: The lung bases are clear. No pleural effusion. The heart is normal in size. No pericardial effusion. Hepatobiliary: The liver is normal in size. Non-cirrhotic configuration. No suspicious mass. Focal area of fatty infiltration noted in the left hepatic lobe, segment 4 B near the falciform ligament attachment site. There are 2, sub 5 mm, hypoattenuating foci in the liver, which are too small to adequately characterize. No intrahepatic or extrahepatic bile duct dilation. No calcified gallstones. Normal gallbladder wall thickness. No pericholecystic inflammatory changes. Pancreas: Unremarkable. No pancreatic ductal dilatation or surrounding inflammatory changes. Spleen: Within normal limits. No focal lesion. Adrenals/Urinary Tract: Adrenal glands are unremarkable. No suspicious renal mass. No hydronephrosis. No renal or ureteric calculi. Unremarkable urinary bladder. Stomach/Bowel: No disproportionate dilation of the small or large bowel loops. No evidence of abnormal bowel wall thickening or inflammatory changes. The appendix is unremarkable. There are scattered diverticula mainly in the sigmoid colon, without imaging signs of diverticulitis. Vascular/Lymphatic: No ascites or pneumoperitoneum. No abdominal or pelvic lymphadenopathy, by size criteria. No aneurysmal dilation of the  major abdominal arteries. Reproductive: Normal size prostate. Symmetric seminal vesicles. Other: There is a tiny fat containing umbilical hernia. The soft tissues and abdominal wall are otherwise unremarkable. Musculoskeletal: No suspicious osseous lesions. There are mild multilevel degenerative changes in the visualized spine. IMPRESSION: 1. No acute inflammatory process identified in the abdomen or pelvis. 2. Multiple other nonacute observations, as described above. Electronically Signed   By: Jules Schick M.D.   On: 05/08/2023 09:48    Procedures Procedures    Medications Ordered in ED Medications  ondansetron (ZOFRAN) injection 4 mg (4 mg Intravenous Given 05/08/23 0924)  HYDROmorphone (DILAUDID) injection 0.5 mg (0.5 mg Intravenous Given 05/08/23 0925)  iohexol (OMNIPAQUE) 300 MG/ML solution 100 mL (85 mLs Intravenous Contrast Given 05/08/23 0913)    ED Course/ Medical Decision Making/ A&P                                 Medical Decision Making Amount and/or Complexity of  Data Reviewed Labs: ordered. Radiology: ordered.  Risk Prescription drug management.   Workup here without any acute findings.  CBC white count 5.4 hemoglobin 15.1.  Platelets 202.  Complete metabolic panel potassium down just slightly at 3.4 blood sugar good total bili up at 1.4 ALT 66 alk phos normal renal function normal with GFR greater than 60.  Lipase less than 10 CT scan abdomen and pelvis no acute inflammatory process identified in the abdomen or pelvis.  There were evidence of several hypoattenuating foci in the liver too small to adequately characterize.  Otherwise negative.  Have patient follow-up with primary care doctor will give referral to GI medicine as well.   Final Clinical Impression(s) / ED Diagnoses Final diagnoses:  Generalized abdominal pain    Rx / DC Orders ED Discharge Orders     None         Vanetta Mulders, MD 05/08/23 1018

## 2023-05-08 NOTE — ED Triage Notes (Signed)
In for eval of loss of appetite and abd pain x2.5 weeks. Reports that nothing tastes good and nothing smells good. Was seen by urgent care prior. Nausea but no vomiting. Reports constipation initially the some diarrhea after using Miralax.

## 2023-05-08 NOTE — ED Notes (Signed)
Pt discharged home and given discharge paperwork. Opportunities given for questions. Pt verbalizes understanding. PIV removed x1. Jillyn Hidden , RN

## 2023-05-12 NOTE — Plan of Care (Signed)
CHL Tonsillectomy/Adenoidectomy, Postoperative PEDS care plan entered in error.

## 2023-05-14 ENCOUNTER — Encounter: Payer: Self-pay | Admitting: Geriatric Medicine

## 2024-05-12 NOTE — ED Provider Notes (Signed)
 [facility] Emergency Department Physician Note   Chief Complaint  Patient presents with  . Abdominal Pain   HPI:  Patient is a 36 old male who presents to the ED with concern for abdominal pain.  Patient endorses a few weeks of some generalized upper abdominal pain.  Reports it has been worsening at times and sometimes is worse after certain meals.  He has tried some diet modification and this has seemed to help.  Reports he has been told to an ED there last night and was told he had gallstones.  He is from this area so he flew home today to further discuss his symptoms and gallstones with local surgeons.  He has not had any fever.  Some nausea.  No vomiting.  No diarrhea   Patient History Past Medical History:  Diagnosis Date  . Arm pain   . Neck muscle spasm    Surgical History[1]     ROS: Please refer to HPI for pertinent ROS    Physical Exam: ED Triage Vitals  Temp 05/12/24 2050 97.4 F (36.3 C)  Heart Rate 05/12/24 2050 109  Resp 05/12/24 2050 16  BP 05/12/24 2050 (!) 176/95  MAP (mmHg) 05/12/24 2130 122  SpO2 05/12/24 2050 100 %  O2 Device 05/12/24 2050 None (Room air)  O2 Flow Rate (L/min) --   Weight 05/12/24 2050 74.8 kg (165 lb)   Physical Exam Vitals and nursing note reviewed.  HENT:     Head: Normocephalic.  Eyes:     Extraocular Movements: Extraocular movements intact.     Conjunctiva/sclera: Conjunctivae normal.  Cardiovascular:     Rate and Rhythm: Normal rate and regular rhythm.     Pulses: Normal pulses.  Pulmonary:     Effort: Pulmonary effort is normal. No respiratory distress.  Abdominal:     General: There is no distension.     Tenderness: There is abdominal tenderness (Mild RUQ TTP).  Neurological:     General: No focal deficit present.     Mental Status: He is alert and oriented to person, place, and time.  Psychiatric:        Mood and Affect: Mood normal.        Behavior: Behavior  normal.     Procedures                       Medical Decision Making: On arrival, pt is afebrile, hypertensive on arrival  Per review of outside records, patient was seen at outside ED yesterday ([facility]) during which time he underwent right upper quadrant ultrasound which showed some gallbladder sludge and stone with negative Murphy sign.  Labs at that time showed bilirubin/LFTs within normal limits with normal white blood cell count and normal lipase.  He was started on Pepcid and Carafate with plan for general surgery outpatient follow-up.  Differential Diagnosis: Biliary colic/symptomatic cholelithiasis, cholecystitis, PUD/gastritis, pancreatitis  Diagnostics:  Labs: Screening labs grossly unremarkable  Imaging Reviewed and interpreted by me: Right upper quadrant ultrasound pending  ED Course: Patient presents with a few weeks of waxing waning upper abdominal pain.  Was seen at outside ED yesterday and found to have gallstones.  He is clinically overall well appearing and politely declines need for symptomatic relief at this time. Presenting as this time is most consistent with symptomatic cholelithiasis. Unit secretary has tried to get ultrasound imaging from [place]  pushed into our radiology system.  Upon multiple  checks this does not appear to have passed through. Ultrasound so that our surgery team can confirm gallstones prior to management. Have discussed case with general surgery Dr. Gustavo who recommends n.p.o. at midnight for likely operative cholecystectomy tomorrow.  Patient to be admitted hospitalist service.  They have seen evaluate the patient at bedside.  No further acute events.  Clinical Complexity  Details documented in MDM  The patient presentation is consistent with  acute complicated illness / injury requiring diagnostic workup  Clinical complexity and Data Reviewed: -External Data Reviewed: Labs, notes -Radiology: independent  interpretation -Tests: independent interpretation -Risks:  --OTC drugs --Decision regarding hospitalization Social Drivers of Health with Concerns   Tobacco Use: Medium Risk (05/08/2023)   Received from [facility]   Patient History   . Smoking Tobacco Use: Never   . Smokeless Tobacco Use: Former   . Passive Exposure: Not on file     1. Symptomatic cholelithiasis        ED Disposition     ED Disposition  Observation   Condition  --   Comment  Primary Provider Group: Yes [1]  Provider Group:: [facility] Hospitalist Team [id number] [facility]) [[id number]]               [1] Past Surgical History: Procedure Laterality Date  . BACK SURGERY     Procedure: BACK SURGERY; Fatty tissue removed  . MOUTH SURGERY      Procedure: MOUTH SURGERY  . NECK SURGERY     Procedure: NECK SURGERY

## 2024-05-14 NOTE — Discharge Summary (Signed)
 Hospital Medicine Discharge Summary   Demographics: Roger Gonzales  53 y.o. 1971/01/27 MRN: [id number]    Extended Emergency Contact Information Primary Emergency Contact: Roger Gonzales Mobile Phone: [phone] Relation: Spouse  Full Code  Admit Date: 05/12/2024                            Attending Physician: Roger Lindaman, PA-C / Roger Gonzales* Discharge Date: 05/14/2024  Primary Care Provider: Thora Reggie Nian, NP   [phone]  Consults during this admission: Consult Orders             IP CONSULT TO GENERAL SURGERY       Specialty:  General Surgery  Provider:  (Not yet assigned)             Active & Resolved Diagnosis: Principal Problem:   Symptomatic cholelithiasis Active Problems:   Calculus of gallbladder without cholecystitis without obstruction   Opioid use disorder Resolved Problems:   * No resolved hospital problems. *  Disposition: Patient discharged to Home in stable condition.  Discharge follow-up recommendations : See Hospital Course  Scheduled Future Appointments       Provider Department Dept Phone Center   05/18/2024 9:30 AM Roger Gonzales [facility]  - SURGERY [place] [phone]       Hospital Course:  HPI Roger Gonzales is a 53 y/o male with PMHx significant for opioid use disorder who presented to the [facility] ED 10.22 for abdominal pain. He endorses 6 months of intermittent RUQ abdominal pain. He initially attributed this to a change in his diet; however symptoms persisted and he began to have unintentional weight loss. Over the last week, the pain has become more persistent with worsening nausea. He denies vomiting, diarrhea, constipation, dysuria, hematuria, fever, chills. He further denies chest pain, palpitations, shortness of breath, cough. Of note, the patient is here from an outpatient opioid rehab program (14 days).  ED Course In the ED, he was afebrile, tachycardic to 109, and  hypertensive to the 170s / 100s; no supplemental O2 requirement. Labs were relatively unremarkable; WBCs, liver chemistries, and lipase all WNL. Abdominal US  showed cholelithiasis with mild gallbladder wall thickening (which may be exaggerated by partial contraction) but no pericholecystic fluid or sonographic Murphy sign. Additionally, there is at least mild dilatation of the common bile duct but no obvious obstructing stone/lesion. These findings are indeterminate for obstructive choledocholithiasis/acute cholecystitis. The patient was admitted to the [facility] Medicine service with General Surgery consulting.  Assessment & Plan Symptomatic cholelithiasis The patient presented with a several month history of RUQ pain initially attributed to diet but persisted after dietary changes and weight loss. Pain seems to have worsened recently and this was thought possible due to opioid withdrawal as he is currently in an opioid rehab center, however, pain has continued despite being off all opioids for approximately 2 weeks now. He was seen in an ER in [facility] and was noted to have gallstones with concern for chronic cholecystitis. The patient is from [place] and came back here for further evaluation and treatment. RUQ US  obtained at [facility] showed cholelithiasis with mild gallbladder wall thickening (which may be exaggerated by partial contraction) but no pericholecystic fluid or sonographic Murphy sign. Additionally, there was at least mild dilatation of the common bile duct but no obvious obstructing stone/lesion. Findings were indeterminate for obstructive choledocholithiasis/acute cholecystitis. General Surgery was consulted and Roger Gonzales performed a laparoscopic cholecystectomy with  intraoperative cholangiogram 10.23.25. The procedure was without complication, however, intraoperative cholangiogram showed CBD dilation with narrowing at the distal CBD. Follow-up MRCP WWO showed mild common bile duct dilatation  which tapers distally and without evidence of mass or obstruction. No further work-up was considered necessary. The patient is being discharged with instruction to alternate between Tylenol  and Motrin  as needed for pain. He is to take a PPI while relying on Motrin  for pain control. Follow-up was scheduled with Roger Gonzales on Tuesday, Oct 28 at 0930.   Procedure(s): LAPAROSCOPIC CHOLECYSTECTOMY WITH INTRAOPERATIVE CHOLANGIOGRAM      Wound / Incision Assessment: Refer to Chart Review and Media Tab for images if available.  Wound 05/13/24 Incision Abdomen Anterior (Active)  Date First Assessed: 05/13/24   Pre-Existing Wound: No  Primary Wound Type: Incision  Location: Abdomen  Wound Location Orientation: Anterior  Wound Description (Comments): Trocar sites x4    Vital Sign Range:  Temp:  [97.7 F (36.5 C)-98.7 F (37.1 C)] 97.9 F (36.6 C) Heart Rate:  [68-124] 77 Resp:  [12-20] 18 BP: (119-189)/(81-120) 125/84  Physical Exam Day of Discharge  - VS: Afebrile, normotensive, VSS; no supplemental O2 requirement  - Gen: Alert, oriented, NAD  - CV: RRR, no MGR; no BLE edema, DP pulses 2+ BL  - Lungs: CTA anterolaterally; no wheezes, rhonchi, rales  - Abd: Soft, NT, ND, bowel sounds heard throughout  - Neuro: No facial asymmetry, moving all 4 extremities spontaneously    Discharge Medications     New Medications      Sig Disp Refill Start End  acetaminophen  325 mg tablet Commonly known as: TYLENOL   Take 2 tablets (650 mg total) by mouth every 6 (six) hours as needed for moderate pain (4-6).   0     ibuprofen  600 mg tablet Commonly known as: MOTRIN   Take 1 tablet (600 mg total) by mouth every 6 (six) hours as needed for moderate pain (4-6).  30 tablet  0     omeprazole 20 mg DR capsule Commonly known as: PriLOSEC  Take 1 capsule (20 mg total) by mouth in the morning.  30 capsule  0         Discharge Orders     Discharge instructions - Other (specify)     Comments:  Medication changes:  - START taking / alternating Acetaminophen  (Tylenol ) 650mg  and Ibuprofen  (Motrin ) 600mg  every 6 hours as needed for pain  - START taking Omeprazole (Prilosec) 20mg  every morning 30 - 60 min prior to eating while taking Ibuprofen  (Motrin )  A follow up appointment was scheduled with Roger Gonzales at 0930 on 10.28.25; seek care sooner if you develop fevers, chills, headache, lightheadedness, palpitations, chest pain, shortness of breath, abdominal pain, nausea, vomiting, inability to tolerate oral intake.   Full Code           Lab Results  Component Value Date/Time   HGB 14.5 05/14/2024 07:17 AM   HCT 43.7 05/14/2024 07:17 AM   WBC 10.38 05/14/2024 07:17 AM   PLT 170 05/14/2024 07:17 AM   Lab Results  Component Value Date/Time   NA 138 05/14/2024 07:18 AM   K 4.3 05/14/2024 07:18 AM   CREATININE 0.82 05/14/2024 07:18 AM   BUN 18 05/14/2024 07:18 AM   GLUCOSE 109 (H) 05/14/2024 07:18 AM    Pertinent Imaging: [place] [facility]  Final Result by SUEZANNE, DOCUMENTATION (10/23 1143)    US  Abdomen Limited  Final Result by Debby Catarina Dibble, MD [phone])  US  ABDOMEN LIMITED, 05/12/2024 11:14 PM     INDICATION: Right upper quadrant pain   COMPARISON: None.    TECHNIQUE: Multi-planar, real-time ultrasonography of the abdomen using   grayscale imaging was performed, supplemented by color and/or power   Doppler as needed.    FINDINGS:    .  Pancreas: Visualized portions are unremarkable.  .  Liver: Length = 17.6 cm. Normal contour, echogenicity, and echotexture.   No focal lesions. Visualized portions of the portal and hepatic venous   systems are patent with antegrade flow.  .  Gallbladder: Several shadowing stones noted within a nondistended   gallbladder. Mild diffuse gallbladder wall thickening. Negative   sonographic Murphy's sign.  .  Biliary: CBD maximum caliber = 8.5 mm. No intrahepatic ductal   dilatation.   .  Right Kidney: Length  = 10.1 cm. Normal contour and echogenicity. No   hydronephrosis or perinephric fluid. No focal mass is identified.  .  Peritoneum: No ascites.  .  Additional comments: None.    IMPRESSION:  Cholelithiasis with mild gallbladder wall thickening (which may be   exaggerated by partial contraction) but no pericholecystic fluid or   sonographic Murphy sign. Additionally, there is at least mild dilatation   of the common bile duct but no obvious obstructing stone/lesion. These   findings are indeterminate for obstructive choledocholithiasis/acute   cholecystitis. Would consider further evaluation with a HIDA scan and/or   MRI with MRCP.    Preliminary report submitted by Dr. Lamar Shivers from [facility]   at 11:23 PM on May 12, 2024.        Electronically signed by: Roger J Lindaman, PA-C 05/14/2024 12:54 PM   Time spent on discharge: 40 minutes

## 2024-05-25 ENCOUNTER — Telehealth (HOSPITAL_COMMUNITY): Payer: Self-pay | Admitting: Licensed Clinical Social Worker

## 2024-05-25 NOTE — Telephone Encounter (Signed)
 The therapist returns Roger Gonzales's call leaving a HIPAA compliant voicemail.  Zell Maier, MA, LCSW, [facility], LCAS 05/25/2024

## 2024-05-29 ENCOUNTER — Other Ambulatory Visit: Payer: Self-pay

## 2024-05-29 ENCOUNTER — Encounter (HOSPITAL_BASED_OUTPATIENT_CLINIC_OR_DEPARTMENT_OTHER): Payer: Self-pay

## 2024-05-29 ENCOUNTER — Emergency Department (HOSPITAL_BASED_OUTPATIENT_CLINIC_OR_DEPARTMENT_OTHER)
Admission: EM | Admit: 2024-05-29 | Discharge: 2024-05-29 | Disposition: A | Discharge status: Home / Self Care | Attending: Emergency Medicine | Admitting: Emergency Medicine

## 2024-05-29 DIAGNOSIS — R001 Bradycardia, unspecified: Secondary | ICD-10-CM | POA: Diagnosis not present

## 2024-05-29 DIAGNOSIS — R112 Nausea with vomiting, unspecified: Secondary | ICD-10-CM | POA: Diagnosis present

## 2024-05-29 LAB — LIPASE, BLOOD: Lipase: 12 U/L (ref 11–51)

## 2024-05-29 LAB — COMPREHENSIVE METABOLIC PANEL WITH GFR
ALT: 29 U/L (ref 0–44)
AST: 17 U/L (ref 15–41)
Albumin: 4 g/dL (ref 3.5–5.0)
Alkaline Phosphatase: 97 U/L (ref 38–126)
Anion gap: 9 (ref 5–15)
BUN: 10 mg/dL (ref 6–20)
CO2: 31 mmol/L (ref 22–32)
Calcium: 9.5 mg/dL (ref 8.9–10.3)
Chloride: 98 mmol/L (ref 98–111)
Creatinine, Ser: 0.75 mg/dL (ref 0.61–1.24)
GFR, Estimated: >60 mL/min (ref >60)
Glucose, Bld: 134 mg/dL — ABNORMAL HIGH (ref 70–99)
Potassium: 4.1 mmol/L (ref 3.5–5.1)
Sodium: 138 mmol/L (ref 135–145)
Total Bilirubin: 0.7 mg/dL (ref 0.0–1.2)
Total Protein: 6.4 g/dL — ABNORMAL LOW (ref 6.5–8.1)

## 2024-05-29 LAB — CBC
HCT: 39.9 % (ref 39.0–52.0)
Hemoglobin: 14.2 g/dL (ref 13.0–17.0)
MCH: 29.1 pg (ref 26.0–34.0)
MCHC: 35.6 g/dL (ref 30.0–36.0)
MCV: 81.8 fL (ref 80.0–100.0)
Platelets: 228 K/uL (ref 150–400)
RBC: 4.88 MIL/uL (ref 4.22–5.81)
RDW: 12.4 % (ref 11.5–15.5)
WBC: 6.7 K/uL (ref 4.0–10.5)
nRBC: 0 % (ref 0.0–0.2)

## 2024-05-29 LAB — URINALYSIS, ROUTINE W REFLEX MICROSCOPIC
Bilirubin Urine: NEGATIVE
Glucose, UA: NEGATIVE mg/dL
Hgb urine dipstick: NEGATIVE
Ketones, ur: NEGATIVE mg/dL
Leukocytes,Ua: NEGATIVE
Nitrite: NEGATIVE
Protein, ur: NEGATIVE mg/dL
Specific Gravity, Urine: 1.005 (ref 1.005–1.030)
pH: 7 (ref 5.0–8.0)

## 2024-05-29 MED ORDER — ONDANSETRON HCL 4 MG/2ML IJ SOLN
4.0000 mg | Freq: Once | INTRAMUSCULAR | Status: AC
  Administered 2024-05-29: 4 mg via INTRAVENOUS
  Filled 2024-05-29: qty 2

## 2024-05-29 MED ORDER — ONDANSETRON HCL 4 MG PO TABS
4.0000 mg | ORAL_TABLET | Freq: Three times a day (TID) | ORAL | 0 refills | Status: AC | PRN
Start: 2024-05-29 — End: 2024-06-08

## 2024-05-29 MED ORDER — ONDANSETRON 4 MG PO TBDP: 4.0000 mg | ORAL_TABLET | Freq: Once | ORAL | Status: DC

## 2024-05-29 MED ORDER — SODIUM CHLORIDE 0.9 % IV BOLUS
1000.0000 mL | Freq: Once | INTRAVENOUS | Status: AC
  Administered 2024-05-29: 1000 mL via INTRAVENOUS

## 2024-05-29 NOTE — Discharge Instructions (Addendum)
 It was a pleasure meeting with you today.  As we discussed your lab work and vitals today were unremarkable for any acute findings.  I will send antiemetic medication to your pharmacy for ongoing management of nausea/vomiting symptoms.  Continue to avoid fatty and cholesterol rich foods.  Continue with scheduled appointment with your surgeon next Tuesday.  Please return if you develop fevers, persistent nausea and vomiting, dizziness, chest pain, shortness of breath, yellowing of skin or eyes, severe abdominal pain, or loss of consciousness.

## 2024-05-29 NOTE — ED Provider Notes (Signed)
 [place] [facility] AT [facility] Provider Note   CSN: [id number] Arrival date & time: 05/29/24  1235     Patient presents with: Emesis   Roger Gonzales is a 53 y.o. male.   Patient is here for evaluation of morning sickness with nausea and occasional vomiting, occasional diarrhea, and generally feeling unwell.  Patient had a cholecystectomy on 05/13/2024.  He reports the first couple days after surgery he felt a bit sore in the right upper quadrant.  After a few days he started having nausea upon waking in the mornings with occasional 1 episode of vomiting.  This nauseated feeling improves by midday after eating.  Patient has bowel movements every other day which is his baseline.  His bowel movements have been normal to loose.  He has taken 1 Imodium for this, otherwise he has not tried any other over-the-counter medications for symptoms.  He was not given any medications postsurgery.  He has been trying to eat low-fat, low-cholesterol diet.  In a typical day he will have grilled chicken breast, a salad, and potato soup.  He feels rundown and his family has stated he appears pale in color.  He admits to not sleeping well and having a hard time falling asleep.  He denies nausea, vomiting, or pain waking him from sleep.  He reports mild nausea at this time.  He denies dysuria, hematuria, foul-smelling urine.  He denies fevers, chest pain, shortness of breath, dizziness, or syncope.  He continues to feel some soreness in the right upper quadrant however this is improved surgery and does not change with emesis.  At the recommendation of the surgeon he has been increasing his fluid intake so he does report increased urine frequency.  He has a follow-up appointment with his surgeon next week.  He is concerned about feeling nauseated each morning.  He also reports change in color of stool being red to yellow; he believes this was related to food intake versus frank blood.  The  history is provided by the patient.  Emesis Associated symptoms: no fever and no headaches        Prior to Admission medications   Medication Sig Start Date End Date Taking? Authorizing Provider  diazepam  (VALIUM ) 10 MG tablet Take 1 tablet (10 mg total) by mouth every 6 (six) hours as needed for anxiety. Patient not taking: Reported on 06/20/2018 04/08/18   Ines Onetha NOVAK, MD  oxyCODONE -acetaminophen  (PERCOCET) 10-325 MG tablet Take 1 tablet by mouth every 8 (eight) hours as needed for pain.    [provider]    Allergies: Patient has no known allergies.    Review of Systems  Constitutional:  Positive for appetite change. Negative for fatigue and fever.  Respiratory:  Negative for shortness of breath.   Cardiovascular:  Negative for chest pain, palpitations and leg swelling.  Gastrointestinal:  Positive for nausea and vomiting. Negative for blood in stool and constipation.  Genitourinary:  Negative for difficulty urinating, dysuria, flank pain and urgency.  Skin:  Positive for pallor.  Neurological:  Negative for weakness, numbness and headaches.  Psychiatric/Behavioral:  Negative for confusion.     Updated Vital Signs BP 111/73 (BP Location: Right Arm)   Pulse 83   Temp 98 F (36.7 C)   Resp 16   Ht 6' 1 (1.854 m)   Wt 72.6 kg   SpO2 98%   BMI 21.11 kg/m   Physical Exam Vitals and nursing note reviewed.  Constitutional:  General: He is not in acute distress.    Appearance: Normal appearance. He is not ill-appearing.  HENT:     Head: Normocephalic and atraumatic.     Mouth/Throat:     Mouth: Mucous membranes are dry.     Comments: Dry, cracked lips Eyes:     General: No scleral icterus.    Extraocular Movements: Extraocular movements intact.  Cardiovascular:     Rate and Rhythm: Regular rhythm. Bradycardia present.     Pulses: Normal pulses.     Heart sounds: Normal heart sounds.     Comments: Bradycardia normal for patient per  patient Pulmonary:     Effort: Pulmonary effort is normal. No respiratory distress.     Breath sounds: Normal breath sounds. No stridor. No wheezing or rales.  Abdominal:     General: Abdomen is flat. Bowel sounds are normal. There is no distension.     Palpations: Abdomen is soft.     Tenderness: There is abdominal tenderness (Very mild tenderness with palpation of right upper quadrant by surgical scar). There is no right CVA tenderness, left CVA tenderness or guarding.     Comments: Surgical wound is well-healing with no signs of infection, redness, drainage, or swelling  Skin:    General: Skin is warm and dry.     Capillary Refill: Capillary refill takes less than 2 seconds.     Coloration: Skin is pale. Skin is not jaundiced.  Neurological:     Mental Status: He is alert and oriented to person, place, and time.     (all labs ordered are listed, but only abnormal results are displayed) Labs Reviewed  COMPREHENSIVE METABOLIC PANEL WITH GFR - Abnormal; Notable for the following components:      Result Value   Glucose, Bld 134 (*)    Total Protein 6.4 (*)    All other components within normal limits  URINALYSIS, ROUTINE W REFLEX MICROSCOPIC - Abnormal; Notable for the following components:   Color, Urine COLORLESS (*)    All other components within normal limits  LIPASE, BLOOD  CBC    EKG: None  Radiology: No results found.   Procedures   Medications Ordered in the ED - No data to display    Patient presents to the ED for concern of nausea vomiting, this involves an extensive number of treatment options, and is a complaint that carries with it a high risk of complications and morbidity.  The differential diagnosis includes gastroenteritis, postsurgical complication of cholecystectomy, suspicious food intake, EtOH, UTI, kidney stone, GERD, hepatitis, dehydration, or infectious process.   Co morbidities that complicate the patient evaluation  Recent  cholecystectomy   Additional history obtained:  Additional history obtained from  Outside Medical Records   External records from outside source obtained and reviewed including recent surgical procedure.   Lab Tests:  I Ordered, and personally interpreted labs.  The pertinent results include: Unremarkable vital signs and blood work.  Urinalysis unremarkable.   Medicines ordered and prescription drug management:  I ordered medication including Zofran  and fluids for nausea Reevaluation of the patient after these medicines showed that the patient improved I have reviewed the patients home medicines and have made adjustments as needed   Test Considered:  Right upper quadrant ultrasound Hemoccult stool test   Problem List / ED Course:  Patient is here for evaluation of nausea and vomiting post cholecystectomy.  Vitals and blood work are unremarkable for acute processes.  Spoke with patient about their evaluation studies including  Hemoccult stool test and right upper quadrant ultrasound.  Patient does not feel the studies are necessary at this time and I agree.  He will return if symptoms persist or worsen.  He will continue with scheduled follow-up with his surgeon next week.   Reevaluation:  After the interventions noted above, I reevaluated the patient and found that they have :improved   Dispostion:  After consideration of the diagnostic results and the patients response to treatment, I feel that the patent would benefit from supportive care in the home setting and prescribed antiemetics.  Continue with scheduled follow-up with surgeon on Tuesday of next week.  He will return if he develops fevers, persistent nausea and vomiting, dizziness, chest pain, shortness of breath, yellowing of skin or eyes, severe abdominal pain, or loss of consciousness                                  Medical Decision Making Amount and/or Complexity of Data Reviewed Labs:  ordered.  Risk Prescription drug management.        Final diagnoses:  None    ED Discharge Orders     None          Rosina Almarie DELENA DEVONNA 05/29/24 1830    Emil Share, DO 05/29/24 1948

## 2024-05-29 NOTE — ED Triage Notes (Signed)
 Cholecystectomy approx 15 days ago at [facility]. Since then he is experiencing morning sickness of nausea, vomiting, and chills daily. Sometimes this gets better over the day. He reports that he has lost weight and his color is off. Intermittent diarrhea.

## 2024-05-29 NOTE — ED Notes (Signed)
 Reviewed AVS/discharge instructions with patient. Time allotted for and all questions answered. Patient is agreeable for d/c and escorted to ED exit by staff.
